# Patient Record
Sex: Male | Born: 1985 | Race: Black or African American | Hispanic: No | Marital: Single | Smoking: Never smoker
Health system: Southern US, Community
[De-identification: ages and names within clinical notes are randomized; demographics above are authoritative.]

## PROBLEM LIST (undated history)

## (undated) DIAGNOSIS — Z789 Other specified health status: Secondary | ICD-10-CM

## (undated) DIAGNOSIS — M25519 Pain in unspecified shoulder: Secondary | ICD-10-CM

## (undated) HISTORY — PX: OTHER SURGICAL HISTORY: SHX169

---

## 2008-01-22 ENCOUNTER — Emergency Department (HOSPITAL_COMMUNITY): Admission: EM | Admit: 2008-01-22 | Discharge: 2008-01-22 | Payer: Self-pay | Admitting: Emergency Medicine

## 2011-11-03 ENCOUNTER — Emergency Department (INDEPENDENT_AMBULATORY_CARE_PROVIDER_SITE_OTHER): Payer: Self-pay

## 2011-11-03 ENCOUNTER — Encounter (HOSPITAL_COMMUNITY): Payer: Self-pay | Admitting: *Deleted

## 2011-11-03 ENCOUNTER — Emergency Department (INDEPENDENT_AMBULATORY_CARE_PROVIDER_SITE_OTHER)
Admission: EM | Admit: 2011-11-03 | Discharge: 2011-11-03 | Disposition: A | Discharge status: Home / Self Care | Payer: Self-pay | Source: Home / Self Care | Attending: Family Medicine | Admitting: Family Medicine

## 2011-11-03 DIAGNOSIS — M7551 Bursitis of right shoulder: Secondary | ICD-10-CM

## 2011-11-03 DIAGNOSIS — M719 Bursopathy, unspecified: Secondary | ICD-10-CM

## 2011-11-03 DIAGNOSIS — M67919 Unspecified disorder of synovium and tendon, unspecified shoulder: Secondary | ICD-10-CM

## 2011-11-03 MED ORDER — DICLOFENAC POTASSIUM 50 MG PO TABS: 50.0000 mg | ORAL_TABLET | Freq: Three times a day (TID) | ORAL | Status: DC

## 2011-11-03 NOTE — ED Provider Notes (Signed)
History     CSN: [id number]  Arrival date & time 11/03/11  1233   First MD Initiated Contact with Patient 11/03/11 1246      Chief Complaint  Patient presents with  . Shoulder Pain    (Consider location/radiation/quality/duration/timing/severity/associated sxs/prior treatment) Patient is a 26 y.o. male presenting with shoulder pain. The history is provided by the patient.  Shoulder Pain This is a chronic problem. The current episode started more than 1 week ago (35mo hx, ). The problem has been gradually worsening. Pertinent negatives include no chest pain and no shortness of breath. Associated symptoms comments: No known injury.. Exacerbated by: worse with raising arm.    History reviewed. No pertinent past medical history.  History reviewed. No pertinent past surgical history.  History reviewed. No pertinent family history.  History  Substance Use Topics  . Smoking status: Never Smoker   . Smokeless tobacco: Not on file  . Alcohol Use: No      Review of Systems  Constitutional: Negative.   HENT: Negative.   Respiratory: Negative for shortness of breath.   Cardiovascular: Negative for chest pain.  Musculoskeletal: Positive for joint swelling.  Skin: Negative.     Allergies  Review of patient's allergies indicates no known allergies.  Home Medications   Current Outpatient Rx  Name Route Sig Dispense Refill  . DICLOFENAC POTASSIUM 50 MG PO TABS Oral Take 1 tablet (50 mg total) by mouth 3 (three) times daily. 30 tablet 0    Pulse 77  Temp(Src) 97.6 F (36.4 C) (Oral)  Resp 16  SpO2 98%  Physical Exam  Nursing note and vitals reviewed. Constitutional: He appears well-developed and well-nourished.  Musculoskeletal: He exhibits tenderness.       Arms:   ED Course  Procedures (including critical care time)  Labs Reviewed - No data to display Dg Shoulder Right  11/03/2011  *RADIOLOGY REPORT*  Clinical Data: Right shoulder pain without injury.  RIGHT  SHOULDER - 2+ VIEW  Comparison: None.  Findings: No acute fracture or dislocation.  Visualized portions of the right hemithorax are within normal limits.  IMPRESSION: Normal right shoulder.  Original Report Authenticated By: Consuello Bossier, M.D.     1. Bursitis of right shoulder       MDM  X-rays reviewed and report per radiologist.         Barkley Bruns, MD 11/03/11 [phone]

## 2011-11-03 NOTE — ED Notes (Signed)
Pt with onset of right shoulder pain x 6 months - increased pain recently with minimal movement - feels like a stabbing pain

## 2011-11-12 ENCOUNTER — Telehealth (HOSPITAL_COMMUNITY): Payer: Self-pay | Admitting: *Deleted

## 2012-04-08 ENCOUNTER — Emergency Department (INDEPENDENT_AMBULATORY_CARE_PROVIDER_SITE_OTHER)
Admission: EM | Admit: 2012-04-08 | Discharge: 2012-04-08 | Disposition: A | Discharge status: Home / Self Care | Payer: BC Managed Care – PPO | Source: Home / Self Care | Attending: Emergency Medicine | Admitting: Emergency Medicine

## 2012-04-08 ENCOUNTER — Encounter (HOSPITAL_COMMUNITY): Payer: Self-pay | Admitting: Emergency Medicine

## 2012-04-08 DIAGNOSIS — M25519 Pain in unspecified shoulder: Secondary | ICD-10-CM

## 2012-04-08 DIAGNOSIS — M25539 Pain in unspecified wrist: Secondary | ICD-10-CM

## 2012-04-08 HISTORY — DX: Pain in unspecified shoulder: M25.519

## 2012-04-08 MED ORDER — NAPROXEN 500 MG PO TABS: 500.0000 mg | ORAL_TABLET | Freq: Two times a day (BID) | ORAL | Status: AC

## 2012-04-08 NOTE — ED Provider Notes (Signed)
History     CSN: [id number]  Arrival date & time 04/08/12  1222   First MD Initiated Contact with Patient 04/08/12 1415      Chief Complaint  Patient presents with  . Hand Pain    (Consider location/radiation/quality/duration/timing/severity/associated sxs/prior treatment) HPI Comments: Shoulder pain several months ago was bursitis, tx here at [facility] and by ortho with cortisone injections.  After several treatments/months, pain went away.  Pt reports pain has returned.  Has not tried anything to make it better. Isn't as bad as last time "I didn't want to wait until it was really bad".  Also c/o B hand/wrist pain for 3 weeks.  Also noticed small "bump" on L wrist yesterday that is very painful to touch and with hyperextension of wrist.    Denies injury to wrist/hands or shoulder but does do repetitive motions and lifting at work.   Patient is a 26 y.o. male presenting with hand pain and shoulder pain. The history is provided by the patient.  Hand Pain This is a new problem. Episode onset: 3 weeks ago. The problem occurs constantly. The problem has been gradually worsening. The symptoms are aggravated by exertion (having hands in hyperextended position, and trying to bear weight with hands in this position). Nothing relieves the symptoms. He has tried nothing for the symptoms.  Shoulder Pain This is a recurrent problem. The current episode started more than 1 week ago. The problem occurs constantly. The problem has been gradually worsening. Exacerbated by: ROM, actively lifting with R arm/shoulder. Nothing relieves the symptoms.    Past Medical History  Diagnosis Date  . Shoulder pain     History reviewed. No pertinent past surgical history.  History reviewed. No pertinent family history.  History  Substance Use Topics  . Smoking status: Never Smoker   . Smokeless tobacco: Not on file  . Alcohol Use: No      Review of Systems  Constitutional: Negative for fever and chills.    Musculoskeletal:       B wrist/hand pain, R shoulder pain same as previous  Skin: Negative for wound.  Neurological: Negative for weakness and numbness.    Allergies  Review of patient's allergies indicates no known allergies.  Home Medications   Current Outpatient Rx  Name Route Sig Dispense Refill  . NAPROXEN 500 MG PO TABS Oral Take 1 tablet (500 mg total) by mouth 2 (two) times daily. 20 tablet 0    BP 155/97  Pulse 82  Temp 98 F (36.7 C) (Oral)  Resp 20  SpO2 99%  Physical Exam  Constitutional: He is oriented to person, place, and time. He appears well-developed and well-nourished. No distress.  Pulmonary/Chest: Effort normal.  Musculoskeletal:       Right shoulder: He exhibits decreased range of motion and tenderness. He exhibits no bony tenderness and no swelling.       Left wrist: He exhibits tenderness. He exhibits no bony tenderness.       Arms:      Right hand: Normal.       Left hand: Normal.       Positive Tinel's sign b wrists. R shoulder flexion and abduction reduced due to pain. Pt reports he can move shoulder through complete ROM, but won't because it hurts too much.   Neurological: He is alert and oriented to person, place, and time. No sensory deficit. Gait normal.  Skin: Skin is warm and dry.    ED Course  Procedures (including critical  care time)  Labs Reviewed - No data to display No results found.   1. Wrist pain   2. Shoulder pain       MDM  Possible cyst L wrist. Probable carpal tunnel syndrome.  Given B wrist splints. Pt reports improvement of pain with splints.  Pt to f/u with hand surgeon who can also help address bursitis in shoulder.        Cathlyn Parsons, NP 04/08/12 1428  Cathlyn Parsons, NP 04/08/12 1429

## 2012-04-08 NOTE — Discharge Instructions (Signed)
Call Dr. Carlos Levering office for an appointment.  He is a hand specialist who should be able to help you with your wrist pain.  He can also help take care of your shoulder.  Use the wrist splints as much as possible to help relieve your pain.

## 2012-04-08 NOTE — ED Notes (Signed)
[facility]. At that time was having shoulder pain, now complaining of right shoulder and both hands.  Reports right shoulder pain never went away completely and is worsening now.   If putting pressure on either palm , patient reports pain goes up arms.  Reports pain in hands 3 weeks ago.  No known injury.  Frequently refers to job related skills/tasks causing pain.

## 2012-04-08 NOTE — ED Provider Notes (Signed)
Medical screening examination/treatment/procedure(s) were performed by non-physician practitioner and as supervising physician I was immediately available for consultation/collaboration.  Raynald Blend, MD 04/08/12 1504

## 2015-02-16 ENCOUNTER — Emergency Department (INDEPENDENT_AMBULATORY_CARE_PROVIDER_SITE_OTHER)
Admission: EM | Admit: 2015-02-16 | Discharge: 2015-02-16 | Disposition: A | Discharge status: Home / Self Care | Payer: Self-pay | Source: Home / Self Care | Attending: Family Medicine | Admitting: Family Medicine

## 2015-02-16 ENCOUNTER — Encounter (HOSPITAL_COMMUNITY): Payer: Self-pay

## 2015-02-16 DIAGNOSIS — H6121 Impacted cerumen, right ear: Secondary | ICD-10-CM

## 2015-02-16 NOTE — ED Notes (Signed)
C/o problem after trying to clean right ear on 4-18. Moderate amt of cerumen noted on inspection.NAD. Pain is "8" on 1-10 scale. Denies vertigo

## 2015-02-16 NOTE — Discharge Instructions (Signed)
Cerumen Impaction °A cerumen impaction is when the wax in your ear forms a plug. This plug usually causes reduced hearing. Sometimes it also causes an earache or dizziness. Removing a cerumen impaction can be difficult and painful. The wax sticks to the ear canal. The canal is sensitive and bleeds easily. If you try to remove a heavy wax buildup with a cotton tipped swab, you may push it in further. °Irrigation with water, suction, and small ear curettes may be used to clear out the wax. If the impaction is fixed to the skin in the ear canal, ear drops may be needed for a few days to loosen the wax. People who build up a lot of wax frequently can use ear wax removal products available in your local drugstore. °SEEK MEDICAL CARE IF:  °You develop an earache, increased hearing loss, or marked dizziness. °Document Released: 11/15/2004 Document Revised: 12/31/2011 Document Reviewed: 01/05/2010 °ExitCare® Patient Information ©2015 [employer]. This information is not intended to replace advice given to you by your health care provider. Make sure you discuss any questions you have with your health care provider. ° °

## 2015-02-16 NOTE — ED Provider Notes (Signed)
CSN: [id number]     Arrival date & time 02/16/15  1146 History   First MD Initiated Contact with Patient 02/16/15 1305     Chief Complaint  Patient presents with   Ear Problem   (Consider location/radiation/quality/duration/timing/severity/associated sxs/prior Treatment) HPI Comments: Pain and decreased hearing right ear for 1 week.   History reviewed. No pertinent past medical history. History reviewed. No pertinent past surgical history. History reviewed. No pertinent family history. History  Substance Use Topics   Smoking status: Never Smoker    Smokeless tobacco: Not on file   Alcohol Use: Yes    Review of Systems  Constitutional: Negative.   HENT: Negative for ear discharge and ear pain.        As per history of present illness  Eyes: Negative.   Respiratory: Negative.     Allergies  Review of patient's allergies indicates no known allergies.  Home Medications   Prior to Admission medications   Not on File   BP 141/94 mmHg   Pulse 20   Temp(Src) 99.5 F (37.5 C) (Oral)   Resp 20   SpO2 100% Physical Exam  Constitutional: He is oriented to person, place, and time. He appears well-developed and well-nourished. No distress.  HENT:  Head: Normocephalic and atraumatic.  Left Ear: External ear normal.  Left TM is normal. EAC is clear. Large cerumen impaction to the right ear.  Neck: Normal range of motion. Neck supple.  Pulmonary/Chest: Effort normal. No respiratory distress.  Neurological: He is alert and oriented to person, place, and time.  Skin: Skin is warm and dry.  Psychiatric: He has a normal mood and affect.  Nursing note and vitals reviewed.   ED Course  EAR CERUMEN REMOVAL Date/Time: 02/16/2015 2:24 PM Performed by: Phineas RealMABE, Temia Debroux Authorized by: Bradd CanaryKINDL, JAMES D Consent: Verbal consent obtained. Risks and benefits: risks, benefits and alternatives were discussed Consent given by: patient Patient understanding: patient states understanding of the  procedure being performed Patient identity confirmed: verbally with patient Local anesthetic: none Location details: right ear Procedure type: irrigation Patient sedated: no Patient tolerance: Patient tolerated the procedure well with no immediate complications   (including critical care time) Labs Review Labs Reviewed - No data to display  Imaging Review No results found.   MDM   1. Cerumen impaction, right    Ear irrigated til clear. Pt feels better. No pain. EAC is clear. TM normal.    Hayden Rasmussenavid Kathlee Barnhardt, NP 02/16/15 1425

## 2015-08-21 ENCOUNTER — Emergency Department (HOSPITAL_COMMUNITY)
Admission: EM | Admit: 2015-08-21 | Discharge: 2015-08-22 | Disposition: A | Discharge status: Home / Self Care | Payer: Self-pay | Attending: Emergency Medicine | Admitting: Emergency Medicine

## 2015-08-21 ENCOUNTER — Emergency Department (HOSPITAL_COMMUNITY): Payer: Self-pay

## 2015-08-21 ENCOUNTER — Encounter (HOSPITAL_COMMUNITY): Payer: Self-pay | Admitting: *Deleted

## 2015-08-21 DIAGNOSIS — S8011XA Contusion of right lower leg, initial encounter: Secondary | ICD-10-CM | POA: Insufficient documentation

## 2015-08-21 DIAGNOSIS — Y998 Other external cause status: Secondary | ICD-10-CM | POA: Insufficient documentation

## 2015-08-21 DIAGNOSIS — Y9389 Activity, other specified: Secondary | ICD-10-CM | POA: Insufficient documentation

## 2015-08-21 DIAGNOSIS — Y92410 Unspecified street and highway as the place of occurrence of the external cause: Secondary | ICD-10-CM | POA: Insufficient documentation

## 2015-08-21 DIAGNOSIS — S7001XA Contusion of right hip, initial encounter: Secondary | ICD-10-CM | POA: Insufficient documentation

## 2015-08-21 DIAGNOSIS — S3992XA Unspecified injury of lower back, initial encounter: Secondary | ICD-10-CM | POA: Insufficient documentation

## 2015-08-21 MED ORDER — KETOROLAC TROMETHAMINE 60 MG/2ML IM SOLN
60.0000 mg | Freq: Once | INTRAMUSCULAR | Status: AC
  Administered 2015-08-21: 60 mg via INTRAMUSCULAR
  Filled 2015-08-21: qty 2

## 2015-08-21 MED ORDER — IBUPROFEN 800 MG PO TABS: 800.0000 mg | ORAL_TABLET | Freq: Four times a day (QID) | ORAL | Status: DC | PRN
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
## 2015-08-21 NOTE — Discharge Instructions (Signed)
Contusion A contusion is a deep bruise. Contusions are the result of a blunt injury to tissues and muscle fibers under the skin. The injury causes bleeding under the skin. The skin overlying the contusion may turn blue, purple, or yellow. Minor injuries will give you a painless contusion, but more severe contusions may stay painful and swollen for a few weeks.  CAUSES  This condition is usually caused by a blow, trauma, or direct force to an area of the body. SYMPTOMS  Symptoms of this condition include:  Swelling of the injured area.  Pain and tenderness in the injured area.  Discoloration. The area may have redness and then turn blue, purple, or yellow. DIAGNOSIS  This condition is diagnosed based on a physical exam and medical history. An X-ray, CT scan, or MRI may be needed to determine if there are any associated injuries, such as broken bones (fractures). TREATMENT  Specific treatment for this condition depends on what area of the body was injured. In general, the best treatment for a contusion is resting, icing, applying pressure to (compression), and elevating the injured area. This is often called the RICE strategy. Over-the-counter anti-inflammatory medicines may also be recommended for pain control.  HOME CARE INSTRUCTIONS   Rest the injured area.  If directed, apply ice to the injured area:  Put ice in a plastic bag.  Place a towel between your skin and the bag.  Leave the ice on for 20 minutes, 2-3 times per day.  If directed, apply light compression to the injured area using an elastic bandage. Make sure the bandage is not wrapped too tightly. Remove and reapply the bandage as directed by your health care provider.  If possible, raise (elevate) the injured area above the level of your heart while you are sitting or lying down.  Take over-the-counter and prescription medicines only as told by your health care provider. SEEK MEDICAL CARE IF:  Your symptoms do not  improve after several days of treatment.  Your symptoms get worse.  You have difficulty moving the injured area. SEEK IMMEDIATE MEDICAL CARE IF:   You have severe pain.  You have numbness in a hand or foot.  Your hand or foot turns pale or cold.   This information is not intended to replace advice given to you by your health care provider. Make sure you discuss any questions you have with your health care provider.   Document Released: 07/18/2005 Document Revised: 06/29/2015 Document Reviewed: 02/23/2015 Elsevier Interactive Patient Education 2016 [employer]  Iliac Crest Contusion An iliac crest contusion is a deep bruise of your hip bone (hip pointer). Contusions are the result of an injury that caused bleeding under the skin. The contusion may turn blue, purple, or yellow. Minor injuries will give you a painless contusion, but more severe contusions may stay painful and swollen for a few weeks.  CAUSES  An iliac crest contusion is usually caused by a blow to the top of your hip pointer. The injury most often comes from contact sports.  SYMPTOMS   Swelling and redness of the hip area.  Bruising of the hip area.  Tenderness or soreness of the hip. DIAGNOSIS  The diagnosis can be made by taking your history and doing a physical exam. You may need an X-ray of your hip pointer to look for a broken bone (fracture). TREATMENT  Often, the best treatment for an iliac crest contusion is resting, icing, elevating, and applying cold compresses to the injured area. Over-the-counter  medicines may also be recommended for pain control. Crutches may be used if walking is very painful. Some people need physical therapy to help with range of motion and strengthening.  HOME CARE INSTRUCTIONS   Put ice on the injured area.  Put ice in a plastic bag.  Place a towel between your skin and the bag.  Leave the ice on for 15-20 minutes, 03-04 times a day.  Only take over-the-counter or  prescription medicines for pain, discomfort, or fever as directed by your caregiver. Your caregiver may recommend avoiding anti-inflammatory medicines (aspirin, ibuprofen, and naproxen) for 48 hours because these medicines may increase bruising.  Keep your leg straightened (extended) when possible.  Walk or move around as the pain allows, or as directed by your caregiver. Use crutches if your caregiver recommends them.  Apply compression wraps as directed by your caregiver. You may remove the wraps for sleeping, showers, and baths. SEEK IMMEDIATE MEDICAL CARE IF:   You have increased bruising or swelling.  You have pain that is getting worse.  Your swelling or pain is not relieved with medicines.  Your toes get cold. MAKE SURE YOU:   Understand these instructions.  Will watch your condition.  Will get help right away if you are not doing well or get worse.   This information is not intended to replace advice given to you by your health care provider. Make sure you discuss any questions you have with your health care provider.   Document Released: 07/03/2001 Document Revised: 12/31/2011 Document Reviewed: 02/23/2015 Elsevier Interactive Patient Education [employer]

## 2015-08-21 NOTE — ED Notes (Signed)
Pt placed in a gown and hooked up to the monitor with a BP cuff and pulse ox 
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
## 2015-08-22 NOTE — ED Notes (Signed)
Reviewed discharge instructions with patient, who verbalized understanding. VSS.

## 2019-05-04 ENCOUNTER — Encounter: Payer: Self-pay | Admitting: Emergency Medicine

## 2019-05-04 ENCOUNTER — Emergency Department: Payer: Self-pay

## 2019-05-04 ENCOUNTER — Other Ambulatory Visit: Payer: Self-pay

## 2019-05-04 ENCOUNTER — Inpatient Hospital Stay
Admission: RE | Admit: 2019-05-04 | Discharge: 2019-05-06 | DRG: 558 | Disposition: A | Discharge status: Home / Self Care | Payer: Self-pay | Attending: Internal Medicine | Admitting: Internal Medicine

## 2019-05-04 DIAGNOSIS — Z833 Family history of diabetes mellitus: Secondary | ICD-10-CM

## 2019-05-04 DIAGNOSIS — E876 Hypokalemia: Secondary | ICD-10-CM | POA: Diagnosis present

## 2019-05-04 DIAGNOSIS — N179 Acute kidney failure, unspecified: Secondary | ICD-10-CM | POA: Diagnosis present

## 2019-05-04 DIAGNOSIS — Z20828 Contact with and (suspected) exposure to other viral communicable diseases: Secondary | ICD-10-CM | POA: Diagnosis present

## 2019-05-04 DIAGNOSIS — E860 Dehydration: Secondary | ICD-10-CM | POA: Diagnosis present

## 2019-05-04 DIAGNOSIS — M6282 Rhabdomyolysis: Secondary | ICD-10-CM | POA: Diagnosis present

## 2019-05-04 HISTORY — DX: Other specified health status: Z78.9

## 2019-05-04 LAB — COMPREHENSIVE METABOLIC PANEL
ALT: 40 U/L (ref 0–44)
AST: 77 U/L — ABNORMAL HIGH (ref 15–41)
Albumin: 5.2 g/dL — ABNORMAL HIGH (ref 3.5–5.0)
Alkaline Phosphatase: 70 U/L (ref 38–126)
Anion gap: 20 — ABNORMAL HIGH (ref 5–15)
BUN: 44 mg/dL — ABNORMAL HIGH (ref 6–20)
CO2: 25 mmol/L (ref 22–32)
Calcium: 10.4 mg/dL — ABNORMAL HIGH (ref 8.9–10.3)
Chloride: 87 mmol/L — ABNORMAL LOW (ref 98–111)
Creatinine, Ser: 2.44 mg/dL — ABNORMAL HIGH (ref 0.61–1.24)
GFR calc Af Amer: 39 mL/min — ABNORMAL LOW (ref >60)
GFR calc non Af Amer: 34 mL/min — ABNORMAL LOW (ref >60)
Glucose, Bld: 137 mg/dL — ABNORMAL HIGH (ref 70–99)
Potassium: 2.8 mmol/L — ABNORMAL LOW (ref 3.5–5.1)
Sodium: 132 mmol/L — ABNORMAL LOW (ref 135–145)
Total Bilirubin: 2.3 mg/dL — ABNORMAL HIGH (ref 0.3–1.2)
Total Protein: 9.4 g/dL — ABNORMAL HIGH (ref 6.5–8.1)

## 2019-05-04 LAB — CBC
HCT: 48.7 % (ref 39.0–52.0)
Hemoglobin: 17.5 g/dL — ABNORMAL HIGH (ref 13.0–17.0)
MCH: 30.3 pg (ref 26.0–34.0)
MCHC: 35.9 g/dL (ref 30.0–36.0)
MCV: 84.3 fL (ref 80.0–100.0)
Platelets: 352 10*3/uL (ref 150–400)
RBC: 5.78 MIL/uL (ref 4.22–5.81)
RDW: 12 % (ref 11.5–15.5)
WBC: 12.3 10*3/uL — ABNORMAL HIGH (ref 4.0–10.5)
nRBC: 0 % (ref 0.0–0.2)

## 2019-05-04 LAB — MAGNESIUM: Magnesium: 2.8 mg/dL — ABNORMAL HIGH (ref 1.7–2.4)

## 2019-05-04 LAB — SARS CORONAVIRUS 2 BY RT PCR (HOSPITAL ORDER, PERFORMED IN ~~LOC~~ HOSPITAL LAB): SARS Coronavirus 2: NEGATIVE

## 2019-05-04 LAB — CK: Total CK: 2397 U/L — ABNORMAL HIGH (ref 49–397)

## 2019-05-04 LAB — LIPASE, BLOOD: Lipase: 42 U/L (ref 11–51)

## 2019-05-04 LAB — LACTIC ACID, PLASMA: Lactic Acid, Venous: 1.7 mmol/L (ref 0.5–1.9)

## 2019-05-04 MED ORDER — SODIUM CHLORIDE 0.9 % IV BOLUS
1000.0000 mL | Freq: Once | INTRAVENOUS | Status: AC
  Administered 2019-05-04: 1000 mL via INTRAVENOUS

## 2019-05-04 MED ORDER — POTASSIUM CHLORIDE CRYS ER 20 MEQ PO TBCR
40.0000 meq | EXTENDED_RELEASE_TABLET | Freq: Once | ORAL | Status: AC
  Administered 2019-05-04: 40 meq via ORAL
  Filled 2019-05-04: qty 2

## 2019-05-04 MED ORDER — PANTOPRAZOLE SODIUM 40 MG IV SOLR
40.0000 mg | Freq: Once | INTRAVENOUS | Status: AC
  Administered 2019-05-04: 40 mg via INTRAVENOUS
  Filled 2019-05-04: qty 40

## 2019-05-04 MED ORDER — ENOXAPARIN SODIUM 30 MG/0.3ML ~~LOC~~ SOLN
30.0000 mg | SUBCUTANEOUS | Status: DC
  Administered 2019-05-04: 18:00:00 30 mg via SUBCUTANEOUS
  Filled 2019-05-04: qty 0.3

## 2019-05-04 MED ORDER — SODIUM CHLORIDE 0.9 % IV SOLN
INTRAVENOUS | Status: DC
  Administered 2019-05-04 – 2019-05-06 (×6): via INTRAVENOUS

## 2019-05-04 MED ORDER — POTASSIUM CHLORIDE 10 MEQ/100ML IV SOLN
10.0000 meq | INTRAVENOUS | Status: DC
  Filled 2019-05-04 (×4): qty 100

## 2019-05-04 MED ORDER — ALUM & MAG HYDROXIDE-SIMETH 200-200-20 MG/5ML PO SUSP
15.0000 mL | Freq: Once | ORAL | Status: AC
  Administered 2019-05-04: 15 mL via ORAL
  Filled 2019-05-04: qty 30

## 2019-05-04 MED ORDER — HYDROMORPHONE HCL 1 MG/ML IJ SOLN
0.5000 mg | INTRAMUSCULAR | Status: AC
  Administered 2019-05-04: 0.5 mg via INTRAVENOUS
  Filled 2019-05-04: qty 1

## 2019-05-04 MED ORDER — ONDANSETRON HCL 4 MG/2ML IJ SOLN
4.0000 mg | Freq: Once | INTRAMUSCULAR | Status: AC
  Administered 2019-05-04: 4 mg via INTRAVENOUS
  Filled 2019-05-04: qty 2

## 2019-05-04 MED ORDER — ACETAMINOPHEN 650 MG RE SUPP: 650.0000 mg | Freq: Four times a day (QID) | RECTAL | Status: DC | PRN

## 2019-05-04 MED ORDER — SODIUM CHLORIDE 0.9% FLUSH
3.0000 mL | Freq: Once | INTRAVENOUS | Status: AC
  Administered 2019-05-04: 3 mL via INTRAVENOUS

## 2019-05-04 MED ORDER — ONDANSETRON HCL 4 MG/2ML IJ SOLN
4.0000 mg | Freq: Once | INTRAMUSCULAR | Status: AC | PRN
  Administered 2019-05-04: 4 mg via INTRAVENOUS
  Filled 2019-05-04: qty 2

## 2019-05-04 MED ORDER — POTASSIUM CHLORIDE 10 MEQ/100ML IV SOLN
10.0000 meq | Freq: Once | INTRAVENOUS | Status: AC
  Administered 2019-05-04: 10 meq via INTRAVENOUS
  Filled 2019-05-04: qty 100

## 2019-05-04 MED ORDER — ONDANSETRON HCL 4 MG/2ML IJ SOLN
4.0000 mg | Freq: Four times a day (QID) | INTRAMUSCULAR | Status: DC | PRN
  Administered 2019-05-04: 18:00:00 4 mg via INTRAVENOUS
  Filled 2019-05-04: qty 2

## 2019-05-04 MED ORDER — ACETAMINOPHEN 325 MG PO TABS: 650.0000 mg | ORAL_TABLET | Freq: Four times a day (QID) | ORAL | Status: DC | PRN

## 2019-05-04 MED ORDER — ENOXAPARIN SODIUM 40 MG/0.4ML ~~LOC~~ SOLN
40.0000 mg | SUBCUTANEOUS | Status: DC
  Administered 2019-05-05: 40 mg via SUBCUTANEOUS
  Filled 2019-05-04: qty 0.4

## 2019-05-04 MED ORDER — ONDANSETRON HCL 4 MG PO TABS: 4.0000 mg | ORAL_TABLET | Freq: Four times a day (QID) | ORAL | Status: DC | PRN

## 2019-05-04 MED ORDER — POTASSIUM CHLORIDE 10 MEQ/100ML IV SOLN
10.0000 meq | INTRAVENOUS | Status: AC
  Administered 2019-05-04 (×3): 10 meq via INTRAVENOUS
  Filled 2019-05-04 (×3): qty 100

## 2019-05-04 MED ORDER — HYDROCODONE-ACETAMINOPHEN 5-325 MG PO TABS
1.0000 | ORAL_TABLET | ORAL | Status: DC | PRN
  Administered 2019-05-04: 2 via ORAL
  Filled 2019-05-04: qty 2

## 2019-05-04 NOTE — Plan of Care (Signed)

## 2019-05-04 NOTE — ED Triage Notes (Signed)
C/O emesis since Wednesday.  States unable to keep anything down at all.

## 2019-05-04 NOTE — ED Notes (Addendum)
Spoke with lab in regards to in house versus send out corona virus collection, [facility] who reports she will change order to in house as ordered by admitted physician.

## 2019-05-04 NOTE — Progress Notes (Signed)
PHARMACIST - PHYSICIAN COMMUNICATION  CONCERNING:  Enoxaparin (Lovenox) for DVT Prophylaxis    RECOMMENDATION: Patient was prescribed enoxaprin 30mg  q24 hours for VTE prophylaxis.   Filed Weights   05/04/19 0958  Weight: 235 lb (106.6 kg)    Body mass index is 31.87 kg/m.  Estimated Creatinine Clearance: 55.3 mL/min (A) (by C-G formula based on SCr of 2.44 mg/dL (H)).   Based on [place] patient is candidate for enoxaparin 40mg  every 24 hours based on CrCl > 62ml/min  DESCRIPTION: Pharmacy has adjusted enoxaparin dose per [facility] policy.   Patient is now receiving enoxaparin 40mg  every 24 hours.  Tawnya Crook, PharmD Clinical Pharmacist  05/04/2019 7:22 PM

## 2019-05-04 NOTE — ED Notes (Addendum)
ED TO INPATIENT HANDOFF REPORT  ED Nurse Name and Phone #: ally [phone]  S Name/Age/Gender Oral Jacob Vargas 33 y.o. male Room/Bed: ED03A/ED03A  Code Status   Code Status: Not on file  Home/SNF/Other Home {Patient oriented [phone]::: yes Is this baseline? Yes   Triage Complete: Triage complete  Chief Complaint vomiting/cp since wed  Triage Note C/O emesis since Wednesday.  States unable to keep anything down at all.   Allergies No Known Allergies  Level of Care/Admitting Diagnosis ED Disposition    ED Disposition Condition Comment   Admit  Hospital Area: [facility] [[id number]]  Level of Care: Med-Surg [16]  Covid Evaluation: Person Under Investigation (PUI)  Diagnosis: Rhabdomyolysis [728.88.ICD-9-CM]  Admitting Physician: Auburn Bilberry [[id number]]  Attending Physician: Auburn Bilberry [[id number]]  Estimated length of stay: past midnight tomorrow  Certification:: I certify this patient will need inpatient services for at least 2 midnights  PT Class (Do Not Modify): Inpatient [101]  PT Acc Code (Do Not Modify): Private [1]       B Medical/Surgery History Past Medical History:  Diagnosis Date  . Patient denies medical problems    Past Surgical History:  Procedure Laterality Date  . pt denies       A IV Location/Drains/Wounds Patient Lines/Drains/Airways Status   Active Line/Drains/Airways    Name:   Placement date:   Placement time:   Site:   Days:   Peripheral IV 05/04/19 Right Antecubital   05/04/19    1008    Antecubital   less than 1          Intake/Output Last 24 hours  Intake/Output Summary (Last 24 hours) at 05/04/2019 1430 Last data filed at 05/04/2019 1201 Gross per 24 hour  Intake 1000 ml  Output --  Net 1000 ml    Labs/Imaging Results for orders placed or performed during the hospital encounter of 05/04/19 (from the past 48 hour(s))  Lipase, blood     Status: None   Collection Time: 05/04/19 10:06 AM  Result  Value Ref Range   Lipase 42 11 - 51 U/L    Comment: Performed at [facility], [address] [place], [place] [address]  Comprehensive metabolic panel     Status: Abnormal   Collection Time: 05/04/19 10:06 AM  Result Value Ref Range   Sodium 132 (L) 135 - 145 mmol/L   Potassium 2.8 (L) 3.5 - 5.1 mmol/L   Chloride 87 (L) 98 - 111 mmol/L   CO2 25 22 - 32 mmol/L   Glucose, Bld 137 (H) 70 - 99 mg/dL   BUN 44 (H) 6 - 20 mg/dL   Creatinine, Ser 8.65 (H) 0.61 - 1.24 mg/dL   Calcium 78.4 (H) 8.9 - 10.3 mg/dL   Total Protein 9.4 (H) 6.5 - 8.1 g/dL   Albumin 5.2 (H) 3.5 - 5.0 g/dL   AST 77 (H) 15 - 41 U/L   ALT 40 0 - 44 U/L   Alkaline Phosphatase 70 38 - 126 U/L   Total Bilirubin 2.3 (H) 0.3 - 1.2 mg/dL   GFR calc non Af Amer 34 (L) >60 mL/min   GFR calc Af Amer 39 (L) >60 mL/min   Anion gap 20 (H) 5 - 15    Comment: Performed at [facility], [address] [place], [place] [address]  CBC     Status: Abnormal   Collection Time: 05/04/19 10:06 AM  Result Value Ref Range   WBC 12.3 (H) 4.0 -  10.5 K/uL   RBC 5.78 4.22 - 5.81 MIL/uL   Hemoglobin 17.5 (H) 13.0 - 17.0 g/dL   HCT 82.9 56.2 - 13.0 %   MCV 84.3 80.0 - 100.0 fL   MCH 30.3 26.0 - 34.0 pg   MCHC 35.9 30.0 - 36.0 g/dL   RDW 86.5 78.4 - 69.6 %   Platelets 352 150 - 400 K/uL   nRBC 0.0 0.0 - 0.2 %    Comment: Performed at [facility], [address], [place], [place] [address]  Magnesium     Status: Abnormal   Collection Time: 05/04/19 10:06 AM  Result Value Ref Range   Magnesium 2.8 (H) 1.7 - 2.4 mg/dL    Comment: Performed at [facility], [address] [place], [place] [address]  CK     Status: Abnormal   Collection Time: 05/04/19 10:06 AM  Result Value Ref Range   Total CK 2,397 (H) 49 - 397 U/L    Comment: Performed at [facility], [address] [place], [place] [address]  Lactic acid, plasma     Status: None   Collection Time: 05/04/19 12:00 PM  Result Value  Ref Range   Lactic Acid, Venous 1.7 0.5 - 1.9 mmol/L    Comment: Performed at [facility], [address] [place], [place] [address]   Ct Abdomen Pelvis Wo Contrast  Result Date: 05/04/2019 CLINICAL DATA:  Nausea, vomiting, abdominal pain and body aches EXAM: CT ABDOMEN AND PELVIS WITHOUT CONTRAST TECHNIQUE: Multidetector CT imaging of the abdomen and pelvis was performed following the standard protocol without IV contrast. COMPARISON:  None. FINDINGS: Lower chest: No acute abnormality. Hepatobiliary: No solid liver abnormality is seen. No gallstones, gallbladder wall thickening, or biliary dilatation. Pancreas: Unremarkable. No pancreatic ductal dilatation or surrounding inflammatory changes. Spleen: Normal in size without significant abnormality. Adrenals/Urinary Tract: Adrenal glands are unremarkable. Kidneys are normal, without renal calculi, solid lesion, or hydronephrosis. Bladder is unremarkable. Stomach/Bowel: Stomach is within normal limits. Appendix appears normal. No evidence of bowel wall thickening, distention, or inflammatory changes. Vascular/Lymphatic: No significant vascular findings are present. No enlarged abdominal or pelvic lymph nodes. Reproductive: No mass or other significant abnormality. Other: No abdominal wall hernia or abnormality. No abdominopelvic ascites. Musculoskeletal: No acute or significant osseous findings. IMPRESSION: No non-contrast CT findings of the abdomen pelvis to explain abdominal pain, nausea, vomiting, or body aches. Electronically Signed   By: Lauralyn Primes M.D.   On: 05/04/2019 12:32    Pending Labs Unresulted Labs (From admission, onward)    Start     Ordered   05/05/19 0500  CK  Tomorrow morning,   STAT     05/04/19 1321   05/05/19 0500  Potassium  Tomorrow morning,   STAT     05/04/19 1336   05/04/19 1409  Urine Drug Screen, Qualitative ([facility] only)  Once,   STAT     05/04/19 1408   05/04/19 1317  SARS Coronavirus 2 (CEPHEID- Performed  in [facility] hospital lab), Hosp Order  (Symptomatic Patients Labs with Precautions )  ONCE - STAT,   STAT     05/04/19 1316   05/04/19 1000  Urinalysis, Complete w Microscopic  ONCE - STAT,   STAT     05/04/19 1005   Signed and Held  HIV antibody (Routine Testing)  Once,   R     Signed and Held   Signed and Held  CBC  (enoxaparin (LOVENOX)    CrCl <  30 ml/min)  Once,   R    Comments: Baseline for enoxaparin therapy IF NOT ALREADY DRAWN.  Notify MD if PLT < 100 K.    Signed and Held   Signed and Held  Creatinine, serum  (enoxaparin (LOVENOX)    CrCl < 30 ml/min)  Once,   R    Comments: Baseline for enoxaparin therapy IF NOT ALREADY DRAWN.    Signed and Held   Signed and Held  Creatinine, serum  (enoxaparin (LOVENOX)    CrCl < 30 ml/min)  Weekly,   R    Comments: while on enoxaparin therapy.    Signed and Held   Signed and Held  CBC  Tomorrow morning,   R     Signed and Held   Signed and Held  Comprehensive metabolic panel  Tomorrow morning,   R     Signed and Held          Vitals/Pain Today's Vitals   05/04/19 1130 05/04/19 1345 05/04/19 1400 05/04/19 1429  BP: (!) 137/93 (!) 144/96 (!) 146/93   Pulse: 93 (!) 108 82   Resp:      Temp:      TempSrc:      SpO2: 99% 100% 97%   Weight:      Height:      PainSc:    6     Isolation Precautions Airborne and Contact precautions  Medications Medications  potassium chloride 10 mEq in 100 mL IVPB ( Intravenous Rate/Dose Change 05/04/19 1419)  potassium chloride 10 mEq in 100 mL IVPB (has no administration in time range)  sodium chloride flush (NS) 0.9 % injection 3 mL (3 mLs Intravenous Given 05/04/19 1349)  ondansetron (ZOFRAN) injection 4 mg (4 mg Intravenous Given 05/04/19 1009)  sodium chloride 0.9 % bolus 1,000 mL (0 mLs Intravenous Stopped 05/04/19 1201)  sodium chloride 0.9 % bolus 1,000 mL (0 mLs Intravenous Stopped 05/04/19 1419)  pantoprazole (PROTONIX) injection 40 mg (40 mg Intravenous Given 05/04/19 1159)   HYDROmorphone (DILAUDID) injection 0.5 mg (0.5 mg Intravenous Given 05/04/19 1159)  ondansetron (ZOFRAN) injection 4 mg (4 mg Intravenous Given 05/04/19 1158)  alum & mag hydroxide-simeth (MAALOX/MYLANTA) 200-200-20 MG/5ML suspension 15 mL (15 mLs Oral Given 05/04/19 1159)  HYDROmorphone (DILAUDID) injection 0.5 mg (0.5 mg Intravenous Given 05/04/19 1341)  potassium chloride SA (K-DUR) CR tablet 40 mEq (40 mEq Oral Given 05/04/19 1341)    Mobility walks Low fall risk   Focused Assessments Cardiac Assessment Handoff:    Lab Results  Component Value Date   CKTOTAL 2,397 (H) 05/04/2019   No results found for: DDIMER Does the Patient currently have chest pain? No     R Recommendations: See Admitting Provider Note  Report given to:   Additional Notes:  Dehydration after exertion, elevated kidney function, decreased potassium, elevated CK

## 2019-05-04 NOTE — ED Notes (Signed)
Pt reports he became very hot and fatigued at work on Wednesday, since he has had NV with fluid and food, body aches, syncope with emesis.

## 2019-05-04 NOTE — ED Notes (Signed)
Report to Caryl Pina, Therapist, sports.

## 2019-05-04 NOTE — H&P (Signed)
[facility] - [place] [facility]   PATIENT NAME: Havish Marling    MR#:  [id number]  DATE OF BIRTH:  1986-04-13  DATE OF ADMISSION:  05/04/2019  PRIMARY CARE PHYSICIAN: No primary care provider on file.   REQUESTING/REFERRING PHYSICIAN: Sharyn Creamer, MD  CHIEF COMPLAINT:   Chief Complaint  Patient presents with  . Emesis    HISTORY OF PRESENT ILLNESS: Onel Kong  is a 33 y.o. male with no medical history who is presenting to the ER with complaint of having nausea vomiting and generalized body aches for the past few days.  Patient states that he works for [employer] and he lifted some furniture few days ago in the heat and subsequently started having generalized body aches and severe nausea and vomiting.  Patient states that he has not had any fevers but has had chills.  In the emergency room he was noted to have rhabdomyolysis as well as acute renal failure.  He denies any chest pain or palpitations.       PAST MEDICAL HISTORY:   Past Medical History:  Diagnosis Date  . Patient denies medical problems     PAST SURGICAL HISTORY:  Past Surgical History:  Procedure Laterality Date  . pt denies      SOCIAL HISTORY:  Social History   Tobacco Use  . Smoking status: Never Smoker  . Smokeless tobacco: Never Used  Substance Use Topics  . Alcohol use: Yes    Comment: occasionaly    FAMILY HISTORY:  Family History  Problem Relation Age of Onset  . Diabetes Mother     DRUG ALLERGIES: No Known Allergies  REVIEW OF SYSTEMS:   CONSTITUTIONAL: No fever, positive fatigue or positive weakness.  EYES: No blurred or double vision.  EARS, NOSE, AND THROAT: No tinnitus or ear pain.  RESPIRATORY: No cough, shortness of breath, wheezing or hemoptysis.  CARDIOVASCULAR: No chest pain, orthopnea, edema.  GASTROINTESTINAL: No nausea, vomiting, diarrhea or abdominal pain.  GENITOURINARY: No dysuria, hematuria.  ENDOCRINE: No polyuria, nocturia,   HEMATOLOGY: No anemia, easy bruising or bleeding SKIN: No rash or lesion. MUSCULOSKELETAL: No joint pain or arthritis.  Positive pain diffusely NEUROLOGIC: No tingling, numbness, weakness.  PSYCHIATRY: No anxiety or depression.   MEDICATIONS AT HOME:  Prior to Admission medications   Not on File      PHYSICAL EXAMINATION:   VITAL SIGNS: Blood pressure (!) 137/93, pulse 93, temperature 99 F (37.2 C), temperature source Oral, resp. rate 18, height 6' (1.829 m), weight 106.6 kg, SpO2 99 %.  GENERAL:  33 y.o.-year-old patient lying in the bed with no acute distress.  EYES: Pupils equal, round, reactive to light and accommodation. No scleral icterus. Extraocular muscles intact.  HEENT: Head atraumatic, normocephalic. Oropharynx and nasopharynx clear.  NECK:  Supple, no jugular venous distention. No thyroid enlargement, no tenderness.  LUNGS: Normal breath sounds bilaterally, no wheezing, rales,rhonchi or crepitation. No use of accessory muscles of respiration.  CARDIOVASCULAR: S1, S2 normal. No murmurs, rubs, or gallops.  ABDOMEN: Soft, nontender, nondistended. Bowel sounds present. No organomegaly or mass.  EXTREMITIES: No pedal edema, cyanosis, or clubbing.  NEUROLOGIC: Cranial nerves II through XII are intact. Muscle strength 5/5 in all extremities. Sensation intact. Gait not checked.  PSYCHIATRIC: The patient is alert and oriented x 3.  SKIN: No obvious rash, lesion, or ulcer.   LABORATORY PANEL:   CBC Recent Labs  Lab 05/04/19 1006  WBC 12.3*  HGB 17.5*  HCT 48.7  PLT 352  MCV 84.3  MCH 30.3  MCHC 35.9  RDW 12.0   ------------------------------------------------------------------------------------------------------------------  Chemistries  Recent Labs  Lab 05/04/19 1006  NA 132*  K 2.8*  CL 87*  CO2 25  GLUCOSE 137*  BUN 44*  CREATININE 2.44*  CALCIUM 10.4*  MG 2.8*  AST 77*  ALT 40  ALKPHOS 70  BILITOT 2.3*    ------------------------------------------------------------------------------------------------------------------ estimated creatinine clearance is 55.3 mL/min (A) (by C-G formula based on SCr of 2.44 mg/dL (H)). ------------------------------------------------------------------------------------------------------------------ No results for input(s): TSH, T4TOTAL, T3FREE, THYROIDAB in the last 72 hours.  Invalid input(s): FREET3   Coagulation profile No results for input(s): INR, PROTIME in the last 168 hours. ------------------------------------------------------------------------------------------------------------------- No results for input(s): DDIMER in the last 72 hours. -------------------------------------------------------------------------------------------------------------------  Cardiac Enzymes No results for input(s): CKMB, TROPONINI, MYOGLOBIN in the last 168 hours.  Invalid input(s): CK ------------------------------------------------------------------------------------------------------------------ Invalid input(s): POCBNP  ---------------------------------------------------------------------------------------------------------------  Urinalysis No results found for: COLORURINE, APPEARANCEUR, LABSPEC, PHURINE, GLUCOSEU, HGBUR, BILIRUBINUR, KETONESUR, PROTEINUR, UROBILINOGEN, NITRITE, LEUKOCYTESUR   RADIOLOGY: Ct Abdomen Pelvis Wo Contrast  Result Date: 05/04/2019 CLINICAL DATA:  Nausea, vomiting, abdominal pain and body aches EXAM: CT ABDOMEN AND PELVIS WITHOUT CONTRAST TECHNIQUE: Multidetector CT imaging of the abdomen and pelvis was performed following the standard protocol without IV contrast. COMPARISON:  None. FINDINGS: Lower chest: No acute abnormality. Hepatobiliary: No solid liver abnormality is seen. No gallstones, gallbladder wall thickening, or biliary dilatation. Pancreas: Unremarkable. No pancreatic ductal dilatation or surrounding inflammatory  changes. Spleen: Normal in size without significant abnormality. Adrenals/Urinary Tract: Adrenal glands are unremarkable. Kidneys are normal, without renal calculi, solid lesion, or hydronephrosis. Bladder is unremarkable. Stomach/Bowel: Stomach is within normal limits. Appendix appears normal. No evidence of bowel wall thickening, distention, or inflammatory changes. Vascular/Lymphatic: No significant vascular findings are present. No enlarged abdominal or pelvic lymph nodes. Reproductive: No mass or other significant abnormality. Other: No abdominal wall hernia or abnormality. No abdominopelvic ascites. Musculoskeletal: No acute or significant osseous findings. IMPRESSION: No non-contrast CT findings of the abdomen pelvis to explain abdominal pain, nausea, vomiting, or body aches. Electronically Signed   By: Lauralyn Primes M.D.   On: 05/04/2019 12:32    EKG: Orders placed or performed in visit on 05/04/19  . EKG 12-Lead  . EKG 12-Lead  . EKG 12-Lead    IMPRESSION AND PLAN: Patient is a 33 year old presenting with nausea vomiting generalized body aches  1.  Acute rhabdomyolysis in the setting of heat and lifting of furniture I will give aggressive IV fluids follow her CPK level in the morning  2.  Acute renal failure due to rhabdomyolysis and dehydration we will give aggressive IV fluids follow renal function if no improvement nephrology consult in the morning  3.  Nausea chills etiology unclear I will order a stat COVID test he has a [place] but COVID needs to be ruled out in this patient with unusual symptoms    All the records are reviewed and case discussed with ED provider. Management plans discussed with the patient, family and they are in agreement.  CODE STATUS: Full code    TOTAL TIME TAKING CARE OF THIS PATIENT: 55 minutes.    Auburn Bilberry M.D on 05/04/2019 at 1:28 PM  Between 7am to 6pm - Pager - [phone]  After 6pm go to [email or web address] - password [email or web address]  [facility] Office  [phone]  CC: Primary care physician; No primary care provider on file.

## 2019-05-04 NOTE — ED Notes (Signed)
Pt to receive 4 total potassium runs. Pt alert and oriented X4, active, cooperative, pt in NAD. RR even and unlabored, color WNL.

## 2019-05-04 NOTE — Progress Notes (Signed)
PHARMACY CONSULT NOTE - FOLLOW UP  Pharmacy Consult for Electrolyte Monitoring and Replacement   Recent Labs: Potassium (mmol/L)  Date Value  05/04/2019 2.8 (L)   Magnesium (mg/dL)  Date Value  05/04/2019 2.8 (H)   Calcium (mg/dL)  Date Value  05/04/2019 10.4 (H)   Albumin (g/dL)  Date Value  05/04/2019 5.2 (H)   Sodium (mmol/L)  Date Value  05/04/2019 132 (L)     Assessment: 33 year old male presented with N/V. Potassium on admission 2.8.  Goal of Therapy:  K ~ 4 Mg ~ 2  Plan:  K 2.8. Will give potassium 40 mEq PO x 1 in addition to 10 mEq IV x 4. Patient also has a one time order for 10 mEq IV. This will give the patient a total of 90 mEq potassium.  Will check potassium with morning labs.  Pharmacy to continue to follow and replace electrolytes as needed.  Tawnya Crook ,PharmD Clinical Pharmacist 05/04/2019 1:30 PM

## 2019-05-04 NOTE — ED Provider Notes (Signed)
[facility] Emergency Department Provider Note   ____________________________________________   First MD Initiated Contact with Patient 05/04/19 1141     (approximate)  I have reviewed the triage vital signs and the nursing notes.   HISTORY  Chief Complaint Emesis    HPI Jacob Vargas is a 33 y.o. 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  He got very fatigued, tired, began having nausea vomiting started throwing up that day.  Reports he thinks he was out in the heat and got overheated and dehydrated  Since then has been having body aches, occasional nausea and vomiting.  Feels like his muscles are very achy.  He is not had any exposure to coronavirus.  Denies fever.  No cough no shortness of breath  He reports he has had a burning acidic feeling in his throat and also upper stomach.  He has never had symptoms like this before.  He reports this is his first day of work and he was exerting himself quite heavily  The skin to have intermittent nausea and vomiting I will keep food on his stomach for the last few days.  He continues to feel fatigued, to the point last night that he almost passed out from being so fatigued and lightheaded  History reviewed. No pertinent past medical history.  There are no active problems to display for this patient.   History reviewed. No pertinent surgical history.  Prior to Admission medications   Not on File    Allergies Patient has no known allergies.  No family history on file.  Social History Social History   Tobacco Use   Smoking status: Never Smoker   Smokeless tobacco: Never Used  Substance Use Topics   Alcohol use: Yes   Drug use: Not on file    Review of Systems Constitutional: No fever/chills Eyes: No visual changes. ENT: No sore throat.  Feels burning like acid Cardiovascular: Denies chest pain. Respiratory: Denies shortness of  breath. Gastrointestinal: Stomach feels crampy nausea frequent vomiting cannot keep food on it. Genitourinary: Negative for dysuria. Musculoskeletal: Muscles aching. Skin: Negative for rash. Neurological: Negative for headaches, areas of focal weakness or numbness.    ____________________________________________   PHYSICAL EXAM:  VITAL SIGNS: ED Triage Vitals  Enc Vitals Group     BP 05/04/19 0959 140/72     Pulse Rate 05/04/19 0959 (!) 136     Resp 05/04/19 0959 16     Temp 05/04/19 0959 99 F (37.2 C)     Temp Source 05/04/19 0959 Oral     SpO2 05/04/19 0959 99 %     Weight 05/04/19 0958 235 lb (106.6 kg)     Height 05/04/19 0958 6' (1.829 m)     Head Circumference --      Peak Flow --      Pain Score 05/04/19 0958 0     Pain Loc --      Pain Edu? --      Excl. in GC? --     Constitutional: Alert and oriented.  Mildly ill-appearing appears nauseated.  Appears generally fatigued but in no distress.  Very pleasant Eyes: Conjunctivae are normal. Head: Atraumatic. Nose: No congestion/rhinnorhea. Mouth/Throat: Mucous membranes are very dry. Neck: No stridor.  Cardiovascular: Slightly tachycardic rate, regular rhythm. Grossly normal heart sounds.  Good peripheral circulation. Respiratory: Normal respiratory effort.  No retractions. Lungs CTAB. Gastrointestinal: Soft and reports moderate tenderness throughout without focal rebound or  guarding. No distention. Musculoskeletal: No lower extremity tenderness nor edema. Neurologic:  Normal speech and language. No gross focal neurologic deficits are appreciated.  Skin:  Skin is warm, dry and intact. No rash noted. Psychiatric: Mood and affect are normal. Speech and behavior are normal.  ____________________________________________   LABS (all labs ordered are listed, but only abnormal results are displayed)  Labs Reviewed  COMPREHENSIVE METABOLIC PANEL - Abnormal; Notable for the following components:      Result Value    Sodium 132 (*)    Potassium 2.8 (*)    Chloride 87 (*)    Glucose, Bld 137 (*)    BUN 44 (*)    Creatinine, Ser 2.44 (*)    Calcium 10.4 (*)    Total Protein 9.4 (*)    Albumin 5.2 (*)    AST 77 (*)    Total Bilirubin 2.3 (*)    GFR calc non Af Amer 34 (*)    GFR calc Af Amer 39 (*)    Anion gap 20 (*)    All other components within normal limits  CBC - Abnormal; Notable for the following components:   WBC 12.3 (*)    Hemoglobin 17.5 (*)    All other components within normal limits  MAGNESIUM - Abnormal; Notable for the following components:   Magnesium 2.8 (*)    All other components within normal limits  CK - Abnormal; Notable for the following components:   Total CK 2,397 (*)    All other components within normal limits  NOVEL CORONAVIRUS, NAA (HOSPITAL ORDER, SEND-OUT TO REF LAB)  LIPASE, BLOOD  LACTIC ACID, PLASMA  URINALYSIS, COMPLETE (UACMP) WITH MICROSCOPIC   ____________________________________________  EKG  Reviewed interpreted me at 10 AM Heart rate 120 QRS 100 QTc 500 Sinus tachycardia, left ventricular hypertrophy. ____________________________________________  RADIOLOGY  Ct Abdomen Pelvis Wo Contrast  Result Date: 05/04/2019 CLINICAL DATA:  Nausea, vomiting, abdominal pain and body aches EXAM: CT ABDOMEN AND PELVIS WITHOUT CONTRAST TECHNIQUE: Multidetector CT imaging of the abdomen and pelvis was performed following the standard protocol without IV contrast. COMPARISON:  None. FINDINGS: Lower chest: No acute abnormality. Hepatobiliary: No solid liver abnormality is seen. No gallstones, gallbladder wall thickening, or biliary dilatation. Pancreas: Unremarkable. No pancreatic ductal dilatation or surrounding inflammatory changes. Spleen: Normal in size without significant abnormality. Adrenals/Urinary Tract: Adrenal glands are unremarkable. Kidneys are normal, without renal calculi, solid lesion, or hydronephrosis. Bladder is unremarkable. Stomach/Bowel:  Stomach is within normal limits. Appendix appears normal. No evidence of bowel wall thickening, distention, or inflammatory changes. Vascular/Lymphatic: No significant vascular findings are present. No enlarged abdominal or pelvic lymph nodes. Reproductive: No mass or other significant abnormality. Other: No abdominal wall hernia or abnormality. No abdominopelvic ascites. Musculoskeletal: No acute or significant osseous findings. IMPRESSION: No non-contrast CT findings of the abdomen pelvis to explain abdominal pain, nausea, vomiting, or body aches. Electronically Signed   By: Lauralyn PrimesAlex  Bibbey M.D.   On: 05/04/2019 12:32    CT noncontrast reviewed negative for acute.  Patient cannot tolerate by mouth due to nausea additionally elevated creatinine ____________________________________________   PROCEDURES  Procedure(s) performed: None  Procedures  Critical Care performed: Yes, see critical care note(s)  CRITICAL CARE Performed by: Sharyn CreamerMark Dontavia Brand   Total critical care time: 30 minutes  Critical care time was exclusive of separately billable procedures and treating other patients.  Critical care was necessary to treat or prevent imminent or life-threatening deterioration.  Critical care was time spent personally by me  on the following activities: development of treatment plan with patient and/or surrogate as well as nursing, discussions with consultants, evaluation of patient's response to treatment, examination of patient, obtaining history from patient or surrogate, ordering and performing treatments and interventions, ordering and review of laboratory studies, ordering and review of radiographic studies, pulse oximetry and re-evaluation of patient's condition.  Patient presents has acute kidney injury, nausea vomiting, noted to have anion gap elevation.  ____________________________________________   INITIAL IMPRESSION / ASSESSMENT AND PLAN / ED COURSE  Pertinent labs & imaging results that  were available during my care of the patient were reviewed by me and considered in my medical decision making (see chart for details).   Patient presents reports started feeling nauseated hot dehydrated after several hours of highly exertional work.  Reports ongoing nausea and vomiting, began developing muscle aches and pains thereafter.  Frequent nausea and vomiting.  Denies fever.  No exposure anyone known to have coronavirus.  Denies diarrhea.  No sore throat.  Does report acidic feeling in his throat however.  No loss of sense of smell. Jacob Vargas was evaluated in Emergency Department on 05/04/2019 for the symptoms described in the history of present illness. He was evaluated in the context of the global COVID-19 pandemic, which necessitated consideration that the patient might be at risk for infection with the SARS-CoV-2 virus that causes COVID-19. Institutional protocols and algorithms that pertain to the evaluation of patients at risk for COVID-19 are in a state of rapid change based on information released by regulatory bodies including the CDC and federal and state organizations. These policies and algorithms were followed during the patient's care in the ED.  Patient does not appear to have symptoms most consistent with COVID, rather I suspect he has developed rhabdo likely heat exhaustion and dehydration secondary to new exertional activity.  He has acute kidney injury hypokalemia, CT scan reassuring without acute.  Elevated anion gap likely due to rhabdo.  Lactic acid normal.  Hydrate, replete electrolytes, pain control  ----------------------------------------- 1:04 PM on 05/04/2019 -----------------------------------------  Patient reports still muscle aches, but pain and nausea are improving with hydration and antiemetics and pain medication.  Fully awake and alert.  Patient agreeable understand plan for admission.  Admitted to hospitalist service Dr. Posey Pronto       ____________________________________________   FINAL CLINICAL IMPRESSION(S) / ED DIAGNOSES  Final diagnoses:  Dehydration after exertion  AKI (acute kidney injury) ([place])  Hypokalemia  Non-traumatic rhabdomyolysis        Note:  This document was prepared using Dragon voice recognition software and may include unintentional dictation errors       Delman Kitten, MD 05/04/19 1304

## 2019-05-05 ENCOUNTER — Inpatient Hospital Stay: Payer: Self-pay

## 2019-05-05 LAB — COMPREHENSIVE METABOLIC PANEL
ALT: 30 U/L (ref 0–44)
AST: 49 U/L — ABNORMAL HIGH (ref 15–41)
Albumin: 3.8 g/dL (ref 3.5–5.0)
Alkaline Phosphatase: 51 U/L (ref 38–126)
Anion gap: 8 (ref 5–15)
BUN: 28 mg/dL — ABNORMAL HIGH (ref 6–20)
CO2: 30 mmol/L (ref 22–32)
Calcium: 9 mg/dL (ref 8.9–10.3)
Chloride: 99 mmol/L (ref 98–111)
Creatinine, Ser: 1.88 mg/dL — ABNORMAL HIGH (ref 0.61–1.24)
GFR calc Af Amer: 54 mL/min — ABNORMAL LOW (ref >60)
GFR calc non Af Amer: 47 mL/min — ABNORMAL LOW (ref >60)
Glucose, Bld: 95 mg/dL (ref 70–99)
Potassium: 3.7 mmol/L (ref 3.5–5.1)
Sodium: 137 mmol/L (ref 135–145)
Total Bilirubin: 2 mg/dL — ABNORMAL HIGH (ref 0.3–1.2)
Total Protein: 6.5 g/dL (ref 6.5–8.1)

## 2019-05-05 LAB — BASIC METABOLIC PANEL
Anion gap: 7 (ref 5–15)
BUN: 24 mg/dL — ABNORMAL HIGH (ref 6–20)
CO2: 29 mmol/L (ref 22–32)
Calcium: 8.9 mg/dL (ref 8.9–10.3)
Chloride: 100 mmol/L (ref 98–111)
Creatinine, Ser: 1.59 mg/dL — ABNORMAL HIGH (ref 0.61–1.24)
GFR calc Af Amer: >60 mL/min (ref >60)
GFR calc non Af Amer: 57 mL/min — ABNORMAL LOW (ref >60)
Glucose, Bld: 91 mg/dL (ref 70–99)
Potassium: 3.6 mmol/L (ref 3.5–5.1)
Sodium: 136 mmol/L (ref 135–145)

## 2019-05-05 LAB — URINALYSIS, COMPLETE (UACMP) WITH MICROSCOPIC
Bacteria, UA: NONE SEEN
Bilirubin Urine: NEGATIVE
Glucose, UA: NEGATIVE mg/dL
Hgb urine dipstick: NEGATIVE
Ketones, ur: 5 mg/dL — AB
Leukocytes,Ua: NEGATIVE
Nitrite: NEGATIVE
Protein, ur: NEGATIVE mg/dL
Specific Gravity, Urine: 1.009 (ref 1.005–1.030)
Squamous Epithelial / LPF: NONE SEEN (ref 0–5)
pH: 6 (ref 5.0–8.0)

## 2019-05-05 LAB — URINE DRUG SCREEN, QUALITATIVE (ARMC ONLY)
Amphetamines, Ur Screen: NOT DETECTED
Barbiturates, Ur Screen: NOT DETECTED
Benzodiazepine, Ur Scrn: NOT DETECTED
Cannabinoid 50 Ng, Ur ~~LOC~~: POSITIVE — AB
Cocaine Metabolite,Ur ~~LOC~~: NOT DETECTED
MDMA (Ecstasy)Ur Screen: NOT DETECTED
Methadone Scn, Ur: NOT DETECTED
Opiate, Ur Screen: POSITIVE — AB
Phencyclidine (PCP) Ur S: NOT DETECTED
Tricyclic, Ur Screen: NOT DETECTED

## 2019-05-05 LAB — CBC
HCT: 39.9 % (ref 39.0–52.0)
Hemoglobin: 13.6 g/dL (ref 13.0–17.0)
MCH: 30.4 pg (ref 26.0–34.0)
MCHC: 34.1 g/dL (ref 30.0–36.0)
MCV: 89.1 fL (ref 80.0–100.0)
Platelets: 279 10*3/uL (ref 150–400)
RBC: 4.48 MIL/uL (ref 4.22–5.81)
RDW: 12.2 % (ref 11.5–15.5)
WBC: 9.5 10*3/uL (ref 4.0–10.5)
nRBC: 0 % (ref 0.0–0.2)

## 2019-05-05 LAB — CK
Total CK: 997 U/L — ABNORMAL HIGH (ref 49–397)
Total CK: 781 U/L — ABNORMAL HIGH (ref 49–397)

## 2019-05-05 MED ORDER — CYCLOBENZAPRINE HCL 10 MG PO TABS
5.0000 mg | ORAL_TABLET | Freq: Three times a day (TID) | ORAL | Status: DC
  Administered 2019-05-05 – 2019-05-06 (×4): 5 mg via ORAL
  Filled 2019-05-05 (×4): qty 1

## 2019-05-05 NOTE — TOC Initial Note (Signed)
Transition of Care [facility]) - Initial/Assessment Note    Patient Details  Name: Jacob Vargas MRN: [id number] Date of Birth: [birth year]  [facility] CM/SW Contact:    Mazy Culton, Lenice Llamas Phone Number: [phone]  05/05/2019, 3:31 PM  Clinical Narrative: Clinical Social Worker (CSW) met with patient to provide resources. Patient was alert and oriented X4 and was laying in the bed. CSW introduced self and explained role of CSW department. Per patient he lives alone in [place] and works at [employer] in [place]. 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 Patient reported that that he does not have insurance and is agreeable to going to [facility] to establish a PCP. Per patient he does not have problems affording his medications. CSW provided patient [facility] and Medication Management application. CSW also emailed Lurena Nida at [employer] with patient's permission. Patient reported no other needs or concerns. CSW will continue to follow and assist as needed.              Expected Discharge Plan: Home/Self Care Barriers to Discharge: Continued Medical Work up   Patient Goals and CMS Choice        Expected Discharge Plan and Services Expected Discharge Plan: Home/Self Care In-house Referral: Clinical Social Work     Living arrangements for the past 2 months: Single Family Home Expected Discharge Date: 05/06/19                                    Prior Living Arrangements/Services Living arrangements for the past 2 months: Single Family Home Lives with:: Self Patient language and need for interpreter reviewed:: No Do you feel safe going back to the place where you live?: Yes      Need for Family Participation in Patient Care: No (Comment) Care giver support system in place?: No (comment)   Criminal Activity/Legal Involvement Pertinent to Current Situation/Hospitalization:  No - Comment as needed  Activities of Daily Living Home Assistive Devices/Equipment: None ADL Screening (condition at time of admission) Patient's cognitive ability adequate to safely complete daily activities?: Yes Is the patient deaf or have difficulty hearing?: No Does the patient have difficulty seeing, even when wearing glasses/contacts?: No Does the patient have difficulty concentrating, remembering, or making decisions?: No Patient able to express need for assistance with ADLs?: Yes Does the patient have difficulty dressing or bathing?: No Independently performs ADLs?: Yes (appropriate for developmental age) Does the patient have difficulty walking or climbing stairs?: No Weakness of Legs: Both Weakness of Arms/Hands: Both  Permission Sought/Granted Permission sought to share information with : Other (comment)([facility]) Permission granted to share information with : Yes, Verbal Permission Granted              Emotional Assessment Appearance:: Appears stated age   Affect (typically observed): Pleasant, Calm Orientation: : Oriented to Self, Oriented to Place, Oriented to  Time, Oriented to Situation Alcohol / Substance Use: Not Applicable Psych Involvement: No (comment)  Admission diagnosis:  Hypokalemia [E87.6] AKI (acute kidney injury) ([place]) [N17.9] Non-traumatic rhabdomyolysis [M62.82] Dehydration after exertion [E86.0] Patient Active Problem List   Diagnosis Date Noted   Rhabdomyolysis 05/04/2019   PCP:  Patient, No Pcp Per Pharmacy:   [id number]  [place] Phone: [phone] Fax: [phone]     Social Determinants of Health (SDOH) Interventions    Readmission Risk Interventions No flowsheet data found.

## 2019-05-05 NOTE — Progress Notes (Signed)
PHARMACY CONSULT NOTE - FOLLOW UP  Pharmacy Consult for Electrolyte Monitoring and Replacement   Recent Labs: Potassium (mmol/L)  Date Value  05/05/2019 3.7   Magnesium (mg/dL)  Date Value  05/04/2019 2.8 (H)   Calcium (mg/dL)  Date Value  05/05/2019 9.0   Albumin (g/dL)  Date Value  05/05/2019 3.8   Sodium (mmol/L)  Date Value  05/05/2019 137     Assessment: 33 year old male presented with N/V, rhabdo. Potassium on admission 2.8.  Goal of Therapy:  K ~ 4 Mg ~ 2  Plan:  K 3.7  Scr 1.88. (Mag 2.8 on 7/13) No supplementation at this time. Patient was dehydrated. CK improved F/u am labs Pharmacy to continue to follow and replace electrolytes as needed.  Noralee Space ,PharmD Clinical Pharmacist 05/05/2019 8:08 AM

## 2019-05-05 NOTE — Progress Notes (Signed)
[place] [facility] [place] NAME: Jacob Vargas    MR#:  [id number]  DATE OF BIRTH:  07-12-1986  SUBJECTIVE:  CHIEF COMPLAINT:   Chief Complaint  Patient presents with   Emesis   Came with muscle weakness nausea and vomiting Elevated CK level and renal failure.  Still have significant pain.  REVIEW OF SYSTEMS:  CONSTITUTIONAL: No fever, fatigue or weakness.  EYES: No blurred or double vision.  EARS, NOSE, AND THROAT: No tinnitus or ear pain.  RESPIRATORY: No cough, shortness of breath, wheezing or hemoptysis.  CARDIOVASCULAR: No chest pain, orthopnea, edema.  GASTROINTESTINAL: No nausea, vomiting, diarrhea or abdominal pain.  GENITOURINARY: No dysuria, hematuria.  ENDOCRINE: No polyuria, nocturia,  HEMATOLOGY: No anemia, easy bruising or bleeding SKIN: No rash or lesion. MUSCULOSKELETAL: No joint pain or arthritis.   NEUROLOGIC: No tingling, have numbness, weakness.  PSYCHIATRY: No anxiety or depression.   ROS  DRUG ALLERGIES:  No Known Allergies  VITALS:  Blood pressure 133/85, pulse 96, temperature 98.4 F (36.9 C), temperature source Oral, resp. rate 20, height 6' (1.829 m), weight 106.6 kg, SpO2 100 %.  PHYSICAL EXAMINATION:  GENERAL:  33 y.o.-year-old patient lying in the bed with no acute distress.  EYES: Pupils equal, round, reactive to light and accommodation. No scleral icterus. Extraocular muscles intact.  HEENT: Head atraumatic, normocephalic. Oropharynx and nasopharynx clear.  NECK:  Supple, no jugular venous distention. No thyroid enlargement, no tenderness.  LUNGS: Normal breath sounds bilaterally, no wheezing, rales,rhonchi or crepitation. No use of accessory muscles of respiration.  CARDIOVASCULAR: S1, S2 normal. No murmurs, rubs, or gallops.  ABDOMEN: Soft, left flank tender, nondistended. Bowel sounds present. No organomegaly or mass.  EXTREMITIES: No pedal edema, cyanosis, or clubbing.  NEUROLOGIC: Cranial nerves  II through XII are intact. Muscle strength 5/5 in all extremities. Sensation intact. Gait not checked.  PSYCHIATRIC: The patient is alert and oriented x 3.  SKIN: No obvious rash, lesion, or ulcer.   Physical Exam LABORATORY PANEL:   CBC Recent Labs  Lab 05/05/19 0407  WBC 9.5  HGB 13.6  HCT 39.9  PLT 279   ------------------------------------------------------------------------------------------------------------------  Chemistries  Recent Labs  Lab 05/04/19 1006 05/05/19 0407 05/05/19 1150  NA 132* 137 136  K 2.8* 3.7 3.6  CL 87* 99 100  CO2 25 30 29   GLUCOSE 137* 95 91  BUN 44* 28* 24*  CREATININE 2.44* 1.88* 1.59*  CALCIUM 10.4* 9.0 8.9  MG 2.8*  --   --   AST 77* 49*  --   ALT 40 30  --   ALKPHOS 70 51  --   BILITOT 2.3* 2.0*  --    ------------------------------------------------------------------------------------------------------------------  Cardiac Enzymes No results for input(s): TROPONINI in the last 168 hours. ------------------------------------------------------------------------------------------------------------------  RADIOLOGY:  Ct Abdomen Pelvis Wo Contrast  Result Date: 05/04/2019 CLINICAL DATA:  Nausea, vomiting, abdominal pain and body aches EXAM: CT ABDOMEN AND PELVIS WITHOUT CONTRAST TECHNIQUE: Multidetector CT imaging of the abdomen and pelvis was performed following the standard protocol without IV contrast. COMPARISON:  None. FINDINGS: Lower chest: No acute abnormality. Hepatobiliary: No solid liver abnormality is seen. No gallstones, gallbladder wall thickening, or biliary dilatation. Pancreas: Unremarkable. No pancreatic ductal dilatation or surrounding inflammatory changes. Spleen: Normal in size without significant abnormality. Adrenals/Urinary Tract: Adrenal glands are unremarkable. Kidneys are normal, without renal calculi, solid lesion, or hydronephrosis. Bladder is unremarkable. Stomach/Bowel: Stomach is within normal limits.  Appendix appears normal. No evidence of bowel  wall thickening, distention, or inflammatory changes. Vascular/Lymphatic: No significant vascular findings are present. No enlarged abdominal or pelvic lymph nodes. Reproductive: No mass or other significant abnormality. Other: No abdominal wall hernia or abnormality. No abdominopelvic ascites. Musculoskeletal: No acute or significant osseous findings. IMPRESSION: No non-contrast CT findings of the abdomen pelvis to explain abdominal pain, nausea, vomiting, or body aches. Electronically Signed   By: Lauralyn PrimesAlex  Bibbey M.D.   On: 05/04/2019 12:32   [place] Renal  Result Date: 05/05/2019 CLINICAL DATA:  Acute renal failure. EXAM: RENAL / URINARY TRACT ULTRASOUND COMPLETE COMPARISON:  None. FINDINGS: Right Kidney: Renal measurements: 11.3 x 6 x 6.3 cm = volume: 220 mL . Echogenicity within normal limits. No mass or hydronephrosis visualized. Left Kidney: Renal measurements: 12.1 x 6.4 x 6.1 cm = volume: 249 mL. Echogenicity within normal limits. No mass or hydronephrosis visualized. Bladder: Appears normal for degree of bladder distention. Both distal ureters are shown to be patent at the level of the bladder (bilateral ureteral jets visualized). IMPRESSION: Normal renal ultrasound. Electronically Signed   By: Bary RichardStan  Maynard M.D.   On: 05/05/2019 12:14    ASSESSMENT AND PLAN:   Active Problems:   Rhabdomyolysis  *Acute rhabdomyolysis Elevated CK level, coming down with IV fluids now.,  Continue to monitor CK and renal function.  *Acute renal failure due to rhabdomyolysis Improving with IV fluids.  As patient have left flank pain, will check renal ultrasound.  *Nausea and vomiting Supportive care.    All the records are reviewed and case discussed with Care Management/Social Workerr. Management plans discussed with the patient, family and they are in agreement.  CODE STATUS: Full.  TOTAL TIME TAKING CARE OF THIS PATIENT: 35 minutes.     POSSIBLE D/C IN  1-2 DAYS, DEPENDING ON CLINICAL CONDITION.   Altamese DillingVaibhavkumar Jancarlo Biermann M.D on 05/05/2019   Between 7am to 6pm - Pager - [phone]  After 6pm go to [email or web address] - password [email or web address]  [facility] [place] [facility]  Office  [phone]  CC: Primary care physician; Patient, No Pcp Per  Note: This dictation was prepared with Dragon dictation along with smaller phrase technology. Any transcriptional errors that result from this process are unintentional.

## 2019-05-06 ENCOUNTER — Telehealth: Payer: Self-pay | Admitting: Adult Health Nurse Practitioner

## 2019-05-06 LAB — CK: Total CK: 426 U/L — ABNORMAL HIGH (ref 49–397)

## 2019-05-06 LAB — BASIC METABOLIC PANEL
Anion gap: 6 (ref 5–15)
BUN: 18 mg/dL (ref 6–20)
CO2: 31 mmol/L (ref 22–32)
Calcium: 8.7 mg/dL — ABNORMAL LOW (ref 8.9–10.3)
Chloride: 103 mmol/L (ref 98–111)
Creatinine, Ser: 1.33 mg/dL — ABNORMAL HIGH (ref 0.61–1.24)
GFR calc Af Amer: >60 mL/min (ref >60)
GFR calc non Af Amer: >60 mL/min (ref >60)
Glucose, Bld: 91 mg/dL (ref 70–99)
Potassium: 3.3 mmol/L — ABNORMAL LOW (ref 3.5–5.1)
Sodium: 140 mmol/L (ref 135–145)

## 2019-05-06 LAB — HIV ANTIBODY (ROUTINE TESTING W REFLEX): HIV Screen 4th Generation wRfx: NONREACTIVE

## 2019-05-06 MED ORDER — CYCLOBENZAPRINE HCL 5 MG PO TABS: 5.0000 mg | ORAL_TABLET | Freq: Three times a day (TID) | ORAL | 0 refills | Status: DC

## 2019-05-06 MED ORDER — POTASSIUM CHLORIDE CRYS ER 20 MEQ PO TBCR
20.0000 meq | EXTENDED_RELEASE_TABLET | Freq: Once | ORAL | Status: AC
  Administered 2019-05-06: 09:00:00 20 meq via ORAL
  Filled 2019-05-06: qty 1

## 2019-05-06 NOTE — Progress Notes (Signed)
PHARMACY CONSULT NOTE - FOLLOW UP  Pharmacy Consult for Electrolyte Monitoring and Replacement   Recent Labs: Potassium (mmol/L)  Date Value  05/06/2019 3.3 (L)   Magnesium (mg/dL)  Date Value  05/04/2019 2.8 (H)   Calcium (mg/dL)  Date Value  05/06/2019 8.7 (L)   Albumin (g/dL)  Date Value  05/05/2019 3.8   Sodium (mmol/L)  Date Value  05/06/2019 140     Assessment: 33 year old male presented with N/V, rhabdo. Potassium on admission 2.8.  Goal of Therapy:  K ~ 4 Mg ~ 2  Plan:  K 3.3  Scr 1.33. (Mag 2.8 on 7/13) Will order KCL 20 meq PO x 1. Patient with nausea/vomiting/dehydrated. CK improved. F/u am labs Pharmacy to continue to follow and replace electrolytes as needed.  Noralee Space ,PharmD Clinical Pharmacist 05/06/2019 7:53 AM

## 2019-05-06 NOTE — Discharge Summary (Signed)
[facility] - [place] at [facility]   PATIENT NAME: Jacob Vargas    MR#:  [id number]  DATE OF BIRTH:  [birth year]  DATE OF ADMISSION:  05/04/2019 ADMITTING PHYSICIAN: Auburn Bilberry, MD  DATE OF DISCHARGE: 05/06/2019  1:53 PM  PRIMARY CARE PHYSICIAN: Patient, No Pcp Per    ADMISSION DIAGNOSIS:  Hypokalemia [E87.6] AKI (acute kidney injury) (HCC) [N17.9] Non-traumatic rhabdomyolysis [M62.82] Dehydration after exertion [E86.0]  DISCHARGE DIAGNOSIS:  Active Problems:   Rhabdomyolysis   SECONDARY DIAGNOSIS:   Past Medical History:  Diagnosis Date  . Patient denies medical problems     HOSPITAL COURSE:   *Acute rhabdomyolysis Elevated CK level, coming down with IV fluids now.,  Continue to monitor CK and renal function.  Improved.  *Acute renal failure due to rhabdomyolysis Improving with IV fluids.  As patient have left flank pain, will check renal ultrasound.  *Nausea and vomiting Supportive care.   DISCHARGE CONDITIONS:   Stable.  CONSULTS OBTAINED:    DRUG ALLERGIES:  No Known Allergies  DISCHARGE MEDICATIONS:   Allergies as of 05/06/2019   No Known Allergies     Medication List    TAKE these medications   cyclobenzaprine 5 MG tablet Commonly known as: FLEXERIL Take 1 tablet (5 mg total) by mouth 3 (three) times daily.        DISCHARGE INSTRUCTIONS:   Follow with PMD in 1-2 weeks.  If you experience worsening of your admission symptoms, develop shortness of breath, life threatening emergency, suicidal or homicidal thoughts you must seek medical attention immediately by calling 911 or calling your MD immediately  if symptoms less severe.  You Must read complete instructions/literature along with all the possible adverse reactions/side effects for all the Medicines you take and that have been prescribed to you. Take any new Medicines after you have completely understood and accept all the possible adverse  reactions/side effects.   Please note  You were cared for by a hospitalist during your hospital stay. If you have any questions about your discharge medications or the care you received while you were in the hospital after you are discharged, you can call the unit and asked to speak with the hospitalist on call if the hospitalist that took care of you is not available. Once you are discharged, your primary care physician will handle any further medical issues. Please note that NO REFILLS for any discharge medications will be authorized once you are discharged, as it is imperative that you return to your primary care physician (or establish a relationship with a primary care physician if you do not have one) for your aftercare needs so that they can reassess your need for medications and monitor your lab values.    Today   CHIEF COMPLAINT:   Chief Complaint  Patient presents with  . Emesis    HISTORY OF PRESENT ILLNESS:  Jacob Vargas  is a 33 y.o. male with no medical history who is presenting to the ER with complaint of having nausea vomiting and generalized body aches for the past few days.  Patient states that he works for [employer] and he lifted some furniture few days ago in the heat and subsequently started having generalized body aches and severe nausea and vomiting.  Patient states that he has not had any fevers but has had chills.  In the emergency room he was noted to have rhabdomyolysis as well as acute renal failure.  He denies any chest pain or palpitations.  VITAL SIGNS:  Blood pressure 120/88, pulse 69, temperature 98.6 F (37 C), temperature source Oral, resp. rate 18, height 6' (1.829 m), weight 106.6 kg, SpO2 100 %.  I/O:  No intake or output data in the 24 hours ending 05/08/19 1619  PHYSICAL EXAMINATION:  GENERAL:  33 y.o.-year-old patient lying in the bed with no acute distress.  EYES: Pupils equal, round, reactive to light and accommodation. No scleral  icterus. Extraocular muscles intact.  HEENT: Head atraumatic, normocephalic. Oropharynx and nasopharynx clear.  NECK:  Supple, no jugular venous distention. No thyroid enlargement, no tenderness.  LUNGS: Normal breath sounds bilaterally, no wheezing, rales,rhonchi or crepitation. No use of accessory muscles of respiration.  CARDIOVASCULAR: S1, S2 normal. No murmurs, rubs, or gallops.  ABDOMEN: Soft, left flank tender, nondistended. Bowel sounds present. No organomegaly or mass.  EXTREMITIES: No pedal edema, cyanosis, or clubbing.  NEUROLOGIC: Cranial nerves II through XII are intact. Muscle strength 5/5 in all extremities. Sensation intact. Gait not checked.  PSYCHIATRIC: The patient is alert and oriented x 3.  SKIN: No obvious rash, lesion, or ulcer.    DATA REVIEW:   CBC Recent Labs  Lab 05/05/19 0407  WBC 9.5  HGB 13.6  HCT 39.9  PLT 279    Chemistries  Recent Labs  Lab 05/04/19 1006 05/05/19 0407  05/06/19 0314  NA 132* 137   < > 140  K 2.8* 3.7   < > 3.3*  CL 87* 99   < > 103  CO2 25 30   < > 31  GLUCOSE 137* 95   < > 91  BUN 44* 28*   < > 18  CREATININE 2.44* 1.88*   < > 1.33*  CALCIUM 10.4* 9.0   < > 8.7*  MG 2.8*  --   --   --   AST 77* 49*  --   --   ALT 40 30  --   --   ALKPHOS 70 51  --   --   BILITOT 2.3* 2.0*  --   --    < > = values in this interval not displayed.    Cardiac Enzymes No results for input(s): TROPONINI in the last 168 hours.  Microbiology Results  Results for orders placed or performed during the hospital encounter of 05/04/19  SARS Coronavirus 2 (CEPHEID- Performed in [facility] hospital lab), Hosp Order     Status: None   Collection Time: 05/04/19  1:17 PM   Specimen: Nasopharyngeal Swab  Result Value Ref Range Status   SARS Coronavirus 2 NEGATIVE NEGATIVE Final    Comment: (NOTE) If result is NEGATIVE SARS-CoV-2 target nucleic acids are NOT DETECTED. The SARS-CoV-2 RNA is generally detectable in upper and lower  respiratory  specimens during the acute phase of infection. The lowest  concentration of SARS-CoV-2 viral copies this assay can detect is 250  copies / mL. A negative result does not preclude SARS-CoV-2 infection  and should not be used as the sole basis for treatment or other  patient management decisions.  A negative result may occur with  improper specimen collection / handling, submission of specimen other  than nasopharyngeal swab, presence of viral mutation(s) within the  areas targeted by this assay, and inadequate number of viral copies  (<250 copies / mL). A negative result must be combined with clinical  observations, patient history, and epidemiological information. If result is POSITIVE SARS-CoV-2 target nucleic acids are DETECTED. The SARS-CoV-2 RNA is generally detectable in upper and  lower  respiratory specimens dur ing the acute phase of infection.  Positive  results are indicative of active infection with SARS-CoV-2.  Clinical  correlation with patient history and other diagnostic information is  necessary to determine patient infection status.  Positive results do  not rule out bacterial infection or co-infection with other viruses. If result is PRESUMPTIVE POSTIVE SARS-CoV-2 nucleic acids MAY BE PRESENT.   A presumptive positive result was obtained on the submitted specimen  and confirmed on repeat testing.  While 2019 novel coronavirus  (SARS-CoV-2) nucleic acids may be present in the submitted sample  additional confirmatory testing may be necessary for epidemiological  and / or clinical management purposes  to differentiate between  SARS-CoV-2 and other Sarbecovirus currently known to infect humans.  If clinically indicated additional testing with an alternate test  methodology [id number]) is advised. The SARS-CoV-2 RNA is generally  detectable in upper and lower respiratory sp ecimens during the acute  phase of infection. The expected result is Negative. Fact Sheet for  Patients:  BoilerBrush.com.cy Fact Sheet for Healthcare Providers: https://pope.com/ This test is not yet approved or cleared by the [place] FDA and has been authorized for detection and/or diagnosis of SARS-CoV-2 by FDA under an Emergency Use Authorization (EUA).  This EUA will remain in effect (meaning this test can be used) for the duration of the COVID-19 declaration under Section 564(b)(1) of the Act, 21 U.S.C. section 360bbb-3(b)(1), unless the authorization is terminated or revoked sooner. Performed at [facility], [address], [place], [place] [address]     RADIOLOGY:  No results found.  EKG:   Orders placed or performed during the hospital encounter of 05/04/19  . EKG      Management plans discussed with the patient, family and they are in agreement.  CODE STATUS:  Code Status History    Date Active Date Inactive Code Status Order ID Comments User Context   05/04/2019 1716 05/06/2019 1658 Full Code [id number]  Auburn Bilberry, MD Inpatient   Advance Care Planning Activity      TOTAL TIME TAKING CARE OF THIS PATIENT: 35 minutes.    Altamese Dilling M.D on 05/08/2019 at 4:19 PM  Between 7am to 6pm - Pager - [phone]  After 6pm go to [email or web address] - password [email or web address]  [facility]  Office  [phone]  CC: Primary care physician; Patient, No Pcp Per   Note: This dictation was prepared with Dragon dictation along with smaller phrase technology. Any transcriptional errors that result from this process are unintentional.

## 2019-05-06 NOTE — Progress Notes (Addendum)
Pt being discharged home, discharge instructions reviewed with pt, states understanding, advised to stay hydrated, pt with no complaints at discharge, work not given, refused wheelchair for discharge

## 2019-05-06 NOTE — Telephone Encounter (Signed)
-----   Message from Marcie Mowers sent at 05/06/2019 10:55 AM EDT ----- Regarding: New Patient interview Contact: [phone] Please call patient a referral from [facility].

## 2019-05-06 NOTE — Discharge Instructions (Signed)
Next 2-3 days drink a lot of water and do less straneous activities.

## 2019-05-06 NOTE — Progress Notes (Signed)
[place], [place].   05/06/2019  Patient: Jacob Vargas   Date of Birth:  April 05, 1986  Date of admission:  05/04/2019  Date of Discharge  05/06/2019    To Whom it May Concern:   Jerral Kerlin  may return to work on 05/10/19.  PHYSICAL ACTIVITY:  Full  If you have any questions or concerns, please don't hesitate to call.  Sincerely,   Vaughan Basta M.D Pager Number978 [phone] Office : [phone]   .

## 2019-07-08 ENCOUNTER — Emergency Department
Admission: EM | Admit: 2019-07-08 | Discharge: 2019-07-08 | Disposition: A | Discharge status: Home / Self Care | Payer: Self-pay | Attending: Emergency Medicine | Admitting: Emergency Medicine

## 2019-07-08 ENCOUNTER — Other Ambulatory Visit: Payer: Self-pay

## 2019-07-08 ENCOUNTER — Encounter: Payer: Self-pay | Admitting: Emergency Medicine

## 2019-07-08 DIAGNOSIS — R300 Dysuria: Secondary | ICD-10-CM

## 2019-07-08 DIAGNOSIS — Z202 Contact with and (suspected) exposure to infections with a predominantly sexual mode of transmission: Secondary | ICD-10-CM | POA: Insufficient documentation

## 2019-07-08 DIAGNOSIS — A64 Unspecified sexually transmitted disease: Secondary | ICD-10-CM

## 2019-07-08 LAB — URINALYSIS, COMPLETE (UACMP) WITH MICROSCOPIC
Bacteria, UA: NONE SEEN
Bilirubin Urine: NEGATIVE
Glucose, UA: NEGATIVE mg/dL
Hgb urine dipstick: NEGATIVE
Ketones, ur: 5 mg/dL — AB
Leukocytes,Ua: NEGATIVE
Nitrite: NEGATIVE
Protein, ur: 30 mg/dL — AB
Specific Gravity, Urine: 1.033 — ABNORMAL HIGH (ref 1.005–1.030)
Squamous Epithelial / HPF: NONE SEEN (ref 0–5)
pH: 5 (ref 5.0–8.0)

## 2019-07-08 MED ORDER — LIDOCAINE HCL (PF) 1 % IJ SOLN
5.0000 mL | Freq: Once | INTRAMUSCULAR | Status: AC
  Administered 2019-07-08: 5 mL via INTRADERMAL
  Filled 2019-07-08: qty 5

## 2019-07-08 MED ORDER — CEFTRIAXONE SODIUM 250 MG IJ SOLR
250.0000 mg | Freq: Once | INTRAMUSCULAR | Status: AC
  Administered 2019-07-08: 250 mg via INTRAMUSCULAR
  Filled 2019-07-08: qty 250

## 2019-07-08 MED ORDER — AZITHROMYCIN 500 MG PO TABS
1000.0000 mg | ORAL_TABLET | Freq: Once | ORAL | Status: AC
  Administered 2019-07-08: 1000 mg via ORAL
  Filled 2019-07-08: qty 2

## 2019-07-08 NOTE — ED Provider Notes (Signed)
[facility] Emergency Department Provider Note  ____________________________________________   First MD Initiated Contact with Patient 07/08/19 1555     (approximate)  I have reviewed the triage vital signs and the nursing notes.   HISTORY  Chief Complaint Dysuria    HPI Jacob Vargas is a 33 y.o. male presents emergency department complaint of frequency and burning with urination.  Patient had unprotected sex 2 weeks ago and has been masturbating frequently.  He states that he was with a woman who states she had blisters in her mouth and a dental abscess.  She was seen at [facility] and given hydrocodone.  He is afraid that he has a bacterial infection from her.  He denies any other complaints.  No fever or chills.  He denies penile discharge at this time.    Past Medical History:  Diagnosis Date   Patient denies medical problems     Patient Active Problem List   Diagnosis Date Noted   Rhabdomyolysis 05/04/2019    Past Surgical History:  Procedure Laterality Date   pt denies      Prior to Admission medications   Medication Sig Start Date End Date Taking? Authorizing Provider  cyclobenzaprine (FLEXERIL) 5 MG tablet Take 1 tablet (5 mg total) by mouth 3 (three) times daily. 05/06/19   Altamese DillingVachhani, Vaibhavkumar, MD    Allergies Patient has no known allergies.  Family History  Problem Relation Age of Onset   Diabetes Mother     Social History Social History   Tobacco Use   Smoking status: Never Smoker   Smokeless tobacco: Never Used  Substance Use Topics   Alcohol use: Yes    Comment: occasionaly   Drug use: Yes    Types: Marijuana    Review of Systems  Constitutional: No fever/chills Eyes: No visual changes. ENT: No sore throat. Respiratory: Denies cough Genitourinary: Positive for dysuria. Musculoskeletal: Negative for back pain. Skin: Negative for  rash.    ____________________________________________   PHYSICAL EXAM:  VITAL SIGNS: ED Triage Vitals  Enc Vitals Group     BP 07/08/19 1548 (!) 156/95     Pulse --      Resp 07/08/19 1548 16     Temp 07/08/19 1548 98.4 F (36.9 C)     Temp Source 07/08/19 1548 Oral     SpO2 07/08/19 1548 100 %     Weight 07/08/19 1547 235 lb 0.2 oz (106.6 kg)     Height --      Head Circumference --      Peak Flow --      Pain Score 07/08/19 1547 0     Pain Loc --      Pain Edu? --      Excl. in GC? --     Constitutional: Alert and oriented. Well appearing and in no acute distress. Eyes: Conjunctivae are normal.  Head: Atraumatic. Nose: No congestion/rhinnorhea. Mouth/Throat: Mucous membranes are moist.   Cardiovascular: Normal rate, regular rhythm.  Respiratory: Normal respiratory effort.  No retractions,  GU: No penile discharge noted.  No sores or lesions noted. However, the patient does have a yellow discharge noted in his underwear Musculoskeletal: FROM all extremities, warm and well perfused Neurologic:  Normal speech and language.  Skin:  Skin is warm, dry and intact. No rash noted. Psychiatric: Mood and affect are normal. Speech and behavior are normal.  ____________________________________________   LABS (all labs ordered are listed, but only abnormal results are displayed)  Labs Reviewed  URINALYSIS, COMPLETE (UACMP) WITH MICROSCOPIC - Abnormal; Notable for the following components:      Result Value   Color, Urine YELLOW (*)    APPearance CLEAR (*)    Specific Gravity, Urine 1.033 (*)    Ketones, ur 5 (*)    Protein, ur 30 (*)    All other components within normal limits  GC/CHLAMYDIA PROBE AMP   ____________________________________________   ____________________________________________  RADIOLOGY    ____________________________________________   PROCEDURES  Procedure(s) performed: rocephin 250mg  im, zithromax 1g  po  Procedures    ____________________________________________   INITIAL IMPRESSION / ASSESSMENT AND PLAN / ED COURSE  Pertinent labs & imaging results that were available during my care of the patient were reviewed by me and considered in my medical decision making (see chart for details).   Patient is 33 year old male presents emergency department with complaints of dysuria.  History of unprotected sex.  Physical exam shows patient to appear well.  Exam is unremarkable  UA and GC/chlamydia tests ordered.  UA is normal, GC/chlamydia is still pending  On further discussion with the patient he shows me some yellow discharge in his underwear.  At this time I feel he probably has a STD.  He was treated with Rocephin 250 mg IM and Zithromax 1 g p.o.  He is to notify his partner once his test results have been received.  No sex with anyone until he is been treated for 7 to 10 days.  Explained to him that he needs to wear a condom.  He states he understands and will try to comply.  He was discharged in stable condition.    Jacob Vargas was evaluated in Emergency Department on 07/08/2019 for the symptoms described in the history of present illness. He was evaluated in the context of the global COVID-19 pandemic, which necessitated consideration that the patient might be at risk for infection with the SARS-CoV-2 virus that causes COVID-19. Institutional protocols and algorithms that pertain to the evaluation of patients at risk for COVID-19 are in a state of rapid change based on information released by regulatory bodies including the CDC and federal and state organizations. These policies and algorithms were followed during the patient's care in the ED.   As part of my medical decision making, I reviewed the following data within the [place] notes reviewed and incorporated, Labs reviewed UA is normal, Old chart reviewed, Notes from prior ED visits and [place]  Controlled Substance Database  ____________________________________________   FINAL CLINICAL IMPRESSION(S) / ED DIAGNOSES  Final diagnoses:  STD (male)  Dysuria      NEW MEDICATIONS STARTED DURING THIS VISIT:  New Prescriptions   No medications on file     Note:  This document was prepared using Dragon voice recognition software and may include unintentional dictation errors.    Versie Starks, PA-C 07/08/19 1657    Blake Divine, MD 07/08/19 [phone]

## 2019-07-08 NOTE — ED Notes (Signed)
Patient c/o frequency and burning with urination. Patient states he was masturbating frequently and then had sex with a woman who had "blisters" in her mouth.

## 2019-07-08 NOTE — Discharge Instructions (Signed)
Follow-up with your regular doctor or the [facility] for evaluation of HIV and syphilis.  Please notify any partners that you have had recently that you may have an STD.  Your results should be back to the emergency department in approximately 2 days.  A nurse will call you with results.  If you have not heard from [place] by Monday of next week please call to receive your test results.

## 2019-07-08 NOTE — ED Triage Notes (Signed)
C/o urinary frequency, urgency, and burning x 4 days.

## 2019-07-10 ENCOUNTER — Other Ambulatory Visit: Payer: Self-pay

## 2019-07-10 ENCOUNTER — Emergency Department
Admission: EM | Admit: 2019-07-10 | Discharge: 2019-07-10 | Disposition: A | Discharge status: Home / Self Care | Payer: Self-pay | Attending: Student in an Organized Health Care Education/Training Program | Admitting: Student in an Organized Health Care Education/Training Program

## 2019-07-10 ENCOUNTER — Emergency Department: Payer: Self-pay

## 2019-07-10 DIAGNOSIS — F1210 Cannabis abuse, uncomplicated: Secondary | ICD-10-CM | POA: Insufficient documentation

## 2019-07-10 DIAGNOSIS — R319 Hematuria, unspecified: Secondary | ICD-10-CM | POA: Insufficient documentation

## 2019-07-10 DIAGNOSIS — R300 Dysuria: Secondary | ICD-10-CM | POA: Insufficient documentation

## 2019-07-10 LAB — URINALYSIS, COMPLETE (UACMP) WITH MICROSCOPIC
Bacteria, UA: NONE SEEN
Bilirubin Urine: NEGATIVE
Glucose, UA: NEGATIVE mg/dL
Hgb urine dipstick: NEGATIVE
Ketones, ur: 20 mg/dL — AB
Leukocytes,Ua: NEGATIVE
Nitrite: NEGATIVE
Protein, ur: 30 mg/dL — AB
Specific Gravity, Urine: 1.028 (ref 1.005–1.030)
pH: 5 (ref 5.0–8.0)

## 2019-07-10 MED ORDER — CEPHALEXIN 500 MG PO CAPS
500.0000 mg | ORAL_CAPSULE | Freq: Once | ORAL | Status: AC
  Administered 2019-07-10: 500 mg via ORAL
  Filled 2019-07-10: qty 1

## 2019-07-10 MED ORDER — DOXYCYCLINE HYCLATE 100 MG PO TABS
100.0000 mg | ORAL_TABLET | Freq: Once | ORAL | Status: AC
  Administered 2019-07-10: 100 mg via ORAL
  Filled 2019-07-10: qty 1

## 2019-07-10 MED ORDER — DOXYCYCLINE HYCLATE 50 MG PO CAPS: 100.0000 mg | ORAL_CAPSULE | Freq: Two times a day (BID) | ORAL | 0 refills | Status: AC

## 2019-07-10 MED ORDER — CEPHALEXIN 500 MG PO CAPS: 500.0000 mg | ORAL_CAPSULE | Freq: Two times a day (BID) | ORAL | 0 refills | Status: AC

## 2019-07-10 MED ORDER — METRONIDAZOLE 500 MG PO TABS
2000.0000 mg | ORAL_TABLET | Freq: Once | ORAL | Status: AC
  Administered 2019-07-10: 2000 mg via ORAL
  Filled 2019-07-10: qty 4

## 2019-07-10 NOTE — ED Provider Notes (Signed)
[facility] Emergency Department Provider Note  ____________________________________________  Time seen: Approximately 12:46 PM  I have reviewed the triage vital signs and the nursing notes.   HISTORY  Chief Complaint Urinary Frequency    HPI Jacob Vargas is a 33 y.o. male that presents emergency department for evaluation of dysuria for 4 days.  Patient was seen in this emergency department 2 days ago and treated empirically for gonorrhea and chlamydia with Rocephin and azithromycin.  Gonorrhea and Chlamydia tests are still pending.  Patient states that dysuria has not improved.  He is not having any pain when he is not urinating. He noticed a minimal amount of blood in his underwear 4 days ago. He also noticed to "lumps" to his pelvis 4 days ago that have improved over the last 2 days.  Patient states that he has masturbated this week and does not have any pain with masturbation.  Patient states that 2 weeks ago he had unprotected sexual intercourse with a male that had a dental infection and he is concerned that he may now have a staph infection.  No fever, nausea, vomiting, abdominal pain, flank pain, rash, testicular pain.  Past Medical History:  Diagnosis Date  . Patient denies medical problems     Patient Active Problem List   Diagnosis Date Noted  . Rhabdomyolysis 05/04/2019    Past Surgical History:  Procedure Laterality Date  . pt denies      Prior to Admission medications   Medication Sig Start Date End Date Taking? Authorizing Provider  cephALEXin (KEFLEX) 500 MG capsule Take 1 capsule (500 mg total) by mouth 2 (two) times daily for 10 days. 07/10/19 07/20/19  Enid Derry, PA-C  cyclobenzaprine (FLEXERIL) 5 MG tablet Take 1 tablet (5 mg total) by mouth 3 (three) times daily. 05/06/19   Altamese Dilling, MD  doxycycline (VIBRAMYCIN) 50 MG capsule Take 2 capsules (100 mg total) by mouth 2 (two) times daily for 10 days. 07/10/19  07/20/19  Enid Derry, PA-C    Allergies Patient has no known allergies.  Family History  Problem Relation Age of Onset  . Diabetes Mother     Social History Social History   Tobacco Use  . Smoking status: Never Smoker  . Smokeless tobacco: Never Used  Substance Use Topics  . Alcohol use: Yes    Comment: occasionaly  . Drug use: Yes    Types: Marijuana     Review of Systems  Constitutional: No fever/chills ENT: No upper respiratory complaints. Cardiovascular: No chest pain. Respiratory: No cough. No SOB. Gastrointestinal: No abdominal pain.  No nausea, no vomiting.  Genitourinary: Positive for dysuria.  Negative for frequency and urgency. Musculoskeletal: Negative for musculoskeletal pain. Skin: Negative for rash, abrasions, lacerations, ecchymosis. Neurological: Negative for headaches, numbness or tingling   ____________________________________________   PHYSICAL EXAM:  VITAL SIGNS: ED Triage Vitals  Enc Vitals Group     BP 07/10/19 1219 (!) 144/93     Pulse Rate 07/10/19 1219 91     Resp 07/10/19 1219 16     Temp 07/10/19 1219 98.6 F (37 C)     Temp Source 07/10/19 1219 Oral     SpO2 07/10/19 1219 99 %     Weight 07/10/19 1224 230 lb (104.3 kg)     Height 07/10/19 1224 6' (1.829 m)     Head Circumference --      Peak Flow --      Pain Score 07/10/19 1221 10  Pain Loc --      Pain Edu? --      Excl. in GC? --      Constitutional: Alert and oriented. Well appearing and in no acute distress. Eyes: Conjunctivae are normal. PERRL. EOMI. Head: Atraumatic. ENT:      Ears:      Nose: No congestion/rhinnorhea.      Mouth/Throat: Mucous membranes are moist.  Neck: No stridor.   Cardiovascular: Normal rate, regular rhythm.  Good peripheral circulation. Respiratory: Normal respiratory effort without tachypnea or retractions. Lungs CTAB. Good air entry to the bases with no decreased or absent breath sounds. Musculoskeletal: Full range of motion to  all extremities. No gross deformities appreciated. Neurologic:  Normal speech and language. No gross focal neurologic deficits are appreciated.  Skin:  Skin is warm, dry and intact. No rash noted. Psychiatric: Mood and affect are normal. Speech and behavior are normal. Patient exhibits appropriate insight and judgement.   ____________________________________________   LABS (all labs ordered are listed, but only abnormal results are displayed)  Labs Reviewed  URINALYSIS, COMPLETE (UACMP) WITH MICROSCOPIC - Abnormal; Notable for the following components:      Result Value   Color, Urine YELLOW (*)    APPearance CLEAR (*)    Ketones, ur 20 (*)    Protein, ur 30 (*)    All other components within normal limits  URINE CULTURE  HIV ANTIBODY (ROUTINE TESTING W REFLEX)  RPR   ____________________________________________  EKG   ____________________________________________  RADIOLOGY   Ct Renal Stone Study  Result Date: 07/10/2019 CLINICAL DATA:  Hematuria and dysuria. Reported soft tissue prominence in the groin regions. EXAM: CT ABDOMEN AND PELVIS WITHOUT CONTRAST TECHNIQUE: Multidetector CT imaging of the abdomen and pelvis was performed following the standard protocol without oral or IV contrast. COMPARISON:  May 04, 2019 FINDINGS: Lower chest: Lung bases are clear. Hepatobiliary: No focal liver lesions are evident on this noncontrast enhanced study. Gallbladder wall is not appreciably thickened. There is no biliary duct dilatation. Pancreas: There is no evident pancreatic mass or inflammatory focus. Spleen: No splenic lesions are evident. Adrenals/Urinary Tract: Adrenals bilaterally appear unremarkable. Kidneys bilaterally show no evident mass or hydronephrosis on either side. There is no renal or ureteral calculus on either side. Urinary bladder is midline. The thickness of urinary bladder wall is within normal limits for degree of distention. Stomach/Bowel: There is no appreciable  bowel wall or mesenteric thickening. There is no evident bowel obstruction. The terminal ileum appears unremarkable. There is no appreciable free air or portal venous air. Vascular/Lymphatic: There is no abdominal aortic aneurysm. No vascular lesions are evident on this noncontrast enhanced study. There are several subcentimeter inguinal lymph nodes, considered nonspecific. No adenopathy by size criteria is demonstrated on this study. Reproductive: Prostate and seminal vesicles appear normal in size and contour. No pelvic mass evident. Other: Appendix appears normal. No abscess or ascites evident in the abdomen or pelvis. Musculoskeletal: There is an apparent bone island in each femoral neck. No lytic or destructive bone lesions are evident. There is no intramuscular or abdominal wall lesion. IMPRESSION: 1. Multiple subcentimeter inguinal lymph nodes of uncertain etiology. Suspect reactive/inflammatory etiology as most likely. No frank adenopathy on this study by size criteria. 2. No renal or ureteral calculi. No hydronephrosis. Urinary bladder wall thickness within normal limits. 3. No bowel obstruction. No abscess in the abdomen or pelvis. Appendix appears normal. Electronically Signed   By: Bretta Bang III M.D.   On: 07/10/2019 14:20  ____________________________________________    PROCEDURES  Procedure(s) performed:    Procedures    Medications  metroNIDAZOLE (FLAGYL) tablet 2,000 mg (2,000 mg Oral Given 07/10/19 1506)  cephALEXin (KEFLEX) capsule 500 mg (500 mg Oral Given 07/10/19 1507)  doxycycline (VIBRA-TABS) tablet 100 mg (100 mg Oral Given 07/10/19 1507)     ____________________________________________   INITIAL IMPRESSION / ASSESSMENT AND PLAN / ED COURSE  Pertinent labs & imaging results that were available during my care of the patient were reviewed by me and considered in my medical decision making (see chart for details).  Review of the [place] CSRS was performed in  accordance of the [facility] prior to dispensing any controlled drugs.     Patient presented to emergency department for evaluation of dysuria for 4 days.  Vital signs and exam are reassuring.  Gonorrhea and Chlamydia test from 2 days ago are still pending.  Patient was given Rocephin and azithromycin for gonorrhea and chlamydia 2 days ago.  Patient was given today Flagyl to cover for trichomonas.  Syphilis and HIV tests were ordered.  Urinalysis today is consistent with infection.  Urine was sent for culture.  He will be given doxycycline prescription to cover for gonorrhea and chlamydia following his shot of Rocephin and azithromycin that he had received in the emergency department.  He was also given a prescription for Keflex to cover for bacterial urinary tract infection.  Patient will be discharged home with prescriptions for doxycycline and Keflex. Patient is to follow up with primary care and urology as directed.  Referral was given.  Patient is given ED precautions to return to the ED for any worsening or new symptoms.  Jacob Vargas was evaluated in Emergency Department on 07/10/2019 for the symptoms described in the history of present illness. He was evaluated in the context of the global COVID-19 pandemic, which necessitated consideration that the patient might be at risk for infection with the SARS-CoV-2 virus that causes COVID-19. Institutional protocols and algorithms that pertain to the evaluation of patients at risk for COVID-19 are in a state of rapid change based on information released by regulatory bodies including the CDC and federal and state organizations. These policies and algorithms were followed during the patient's care in the ED.   ____________________________________________  FINAL CLINICAL IMPRESSION(S) / ED DIAGNOSES  Final diagnoses:  Dysuria      NEW MEDICATIONS STARTED DURING THIS VISIT:  ED Discharge Orders         Ordered    cephALEXin (KEFLEX) 500 MG  capsule  2 times daily     07/10/19 1454    doxycycline (VIBRAMYCIN) 50 MG capsule  2 times daily     07/10/19 1454              This chart was dictated using voice recognition software/Dragon. Despite best efforts to proofread, errors can occur which can change the meaning. Any change was purely unintentional.    Enid Derry, PA-C 07/10/19 1914    Willy Eddy, MD 07/13/19 1004

## 2019-07-10 NOTE — ED Notes (Addendum)
ED PA-C Caryl Pina at bedside.

## 2019-07-10 NOTE — ED Triage Notes (Signed)
Pt states he was here 2 days ago and treated for an STD, states he is having increased burning with urination and swollen "knots" at his pelvis.

## 2019-07-10 NOTE — ED Notes (Signed)
This RN spoke with lab tech Levada Dy to add urine culture as add on.

## 2019-07-10 NOTE — Discharge Instructions (Signed)
Your gonorrhea, chlamydia, syphilis, HIV tests are still pending.  You were treated for gonorrhea and chlamydia 2 days ago.  I treated you for trichomonas today as well.  I am giving you a prescription for antibiotics to also treat a bacterial urinary tract infection and gonorrhea/chlamydia.  You can check your results on my chart over the weekend.  Please follow-up with urology next week if symptoms are not improving.

## 2019-07-11 LAB — HIV ANTIBODY (ROUTINE TESTING W REFLEX): HIV Screen 4th Generation wRfx: NONREACTIVE

## 2019-07-11 LAB — URINE CULTURE: Culture: NO GROWTH

## 2019-07-11 LAB — RPR: RPR Ser Ql: NONREACTIVE

## 2019-07-13 LAB — GC/CHLAMYDIA PROBE AMP
Chlamydia trachomatis, NAA: NEGATIVE
Neisseria Gonorrhoeae by PCR: NEGATIVE

## 2020-08-08 IMAGING — CT CT ABDOMEN AND PELVIS WITHOUT CONTRAST
2 of 4 series · 17 of 46 positions shown, 19 images · non-contrast
Comparison: None.

CLINICAL DATA: Nausea, vomiting, abdominal pain and body aches

EXAM:
CT ABDOMEN AND PELVIS WITHOUT CONTRAST
TECHNIQUE: Multidetector CT imaging of the abdomen and pelvis was performed
following the standard protocol without IV contrast.

[Series 2: axial st · axial · 0.76mm/px · z∈[-488,-68]mm · 14 of 92 slices shown, 16 images]
[im 4/92  soft-tissue]
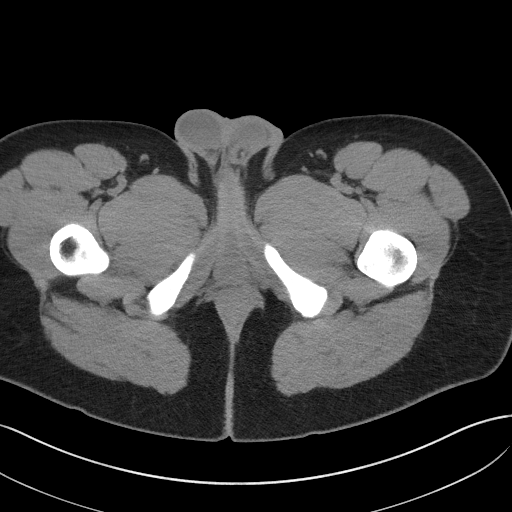
[im 4/92  bone]
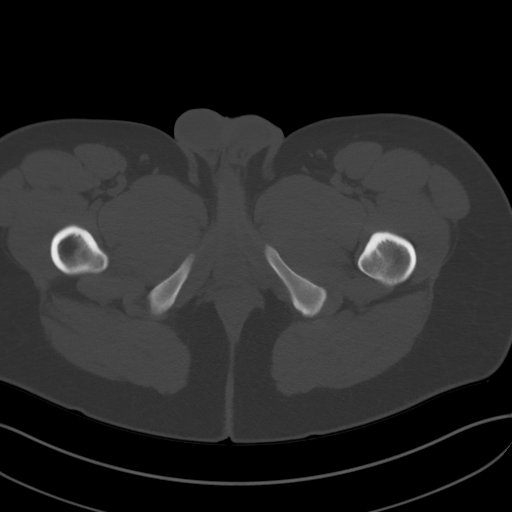
[im 11/92  soft-tissue]
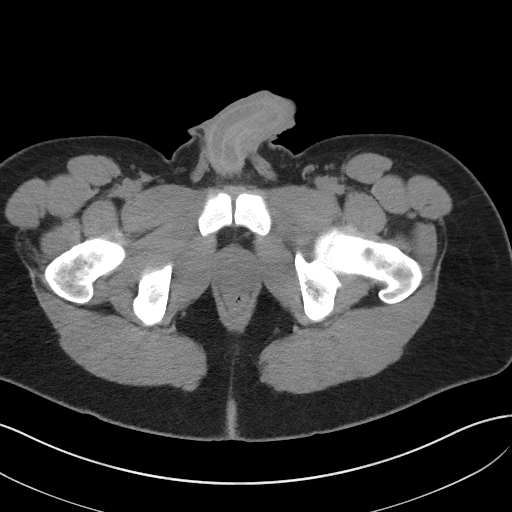
[im 19/92  soft-tissue]
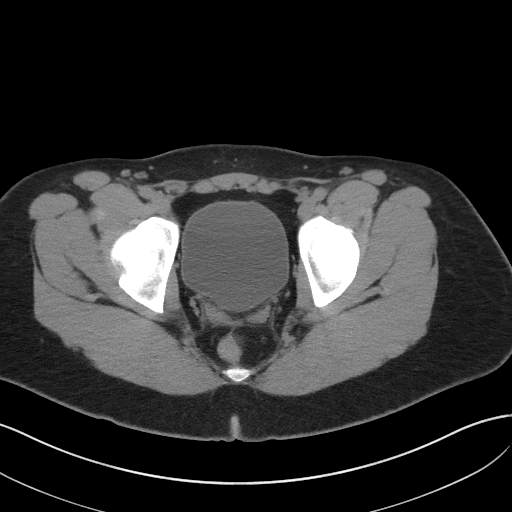
[im 26/92  soft-tissue]
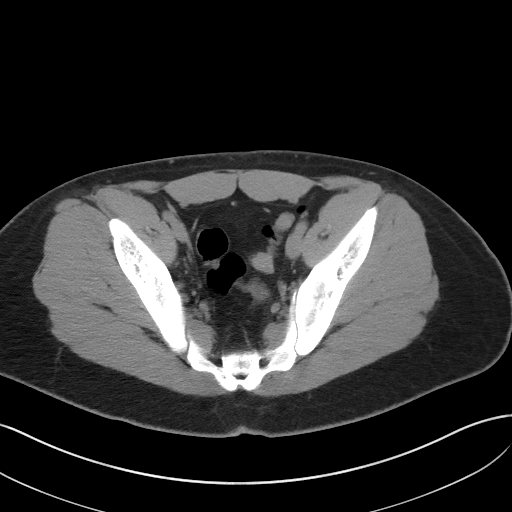
[im 30/92  soft-tissue]
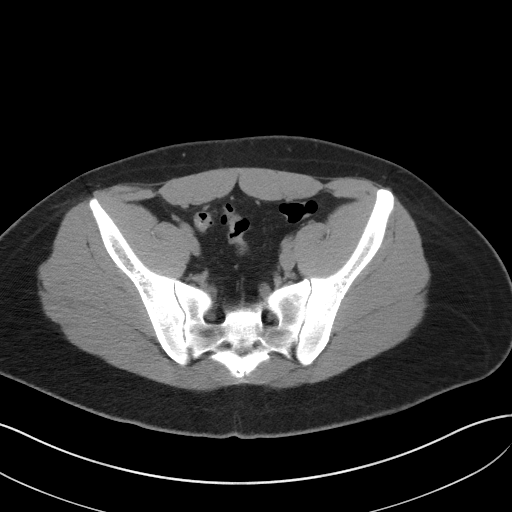
[im 37/92  soft-tissue]
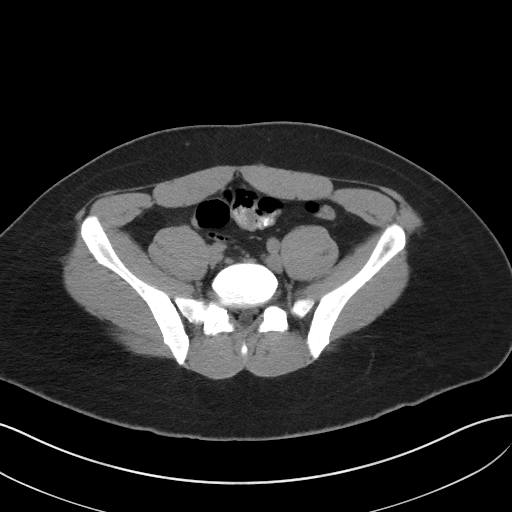
[im 44/92  soft-tissue]
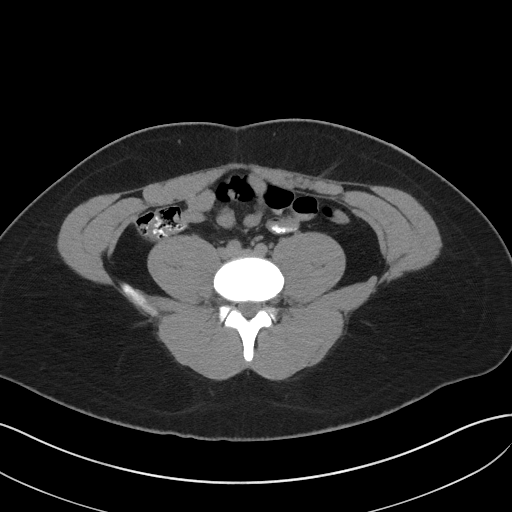
[im 48/92  soft-tissue]
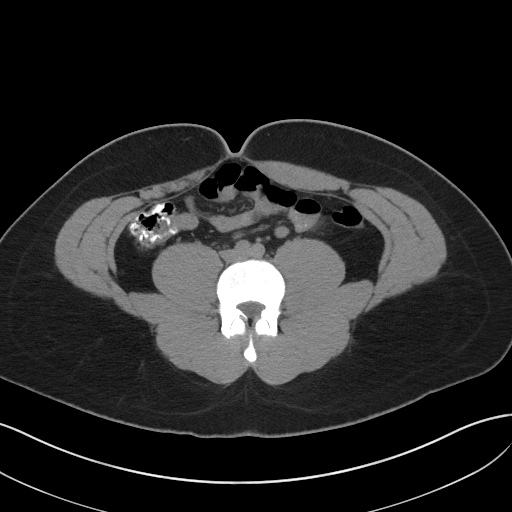
[im 55/92  soft-tissue]
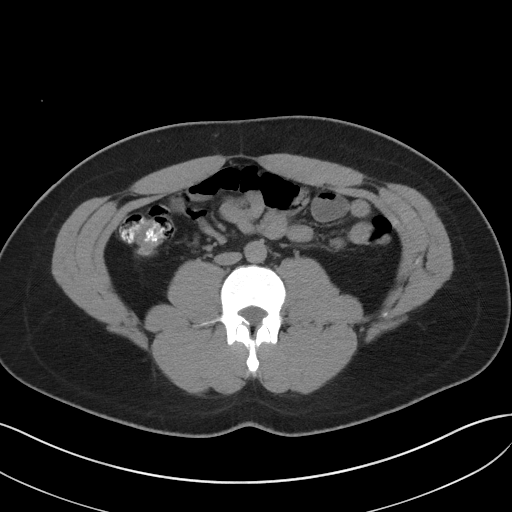
[im 55/92  bone]
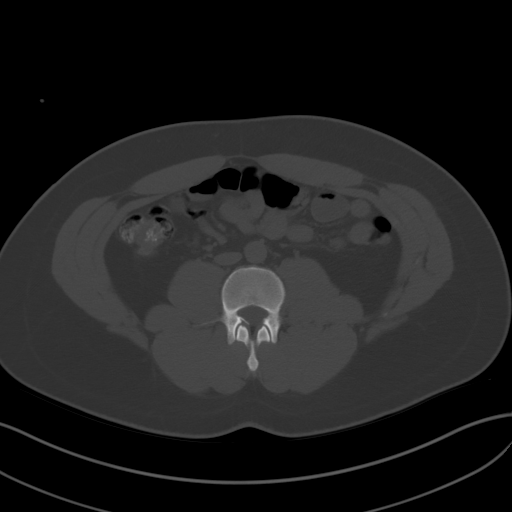
[im 62/92  soft-tissue]
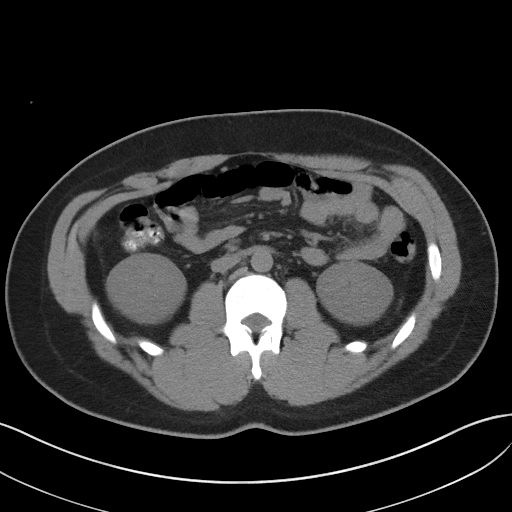
[im 70/92  soft-tissue]
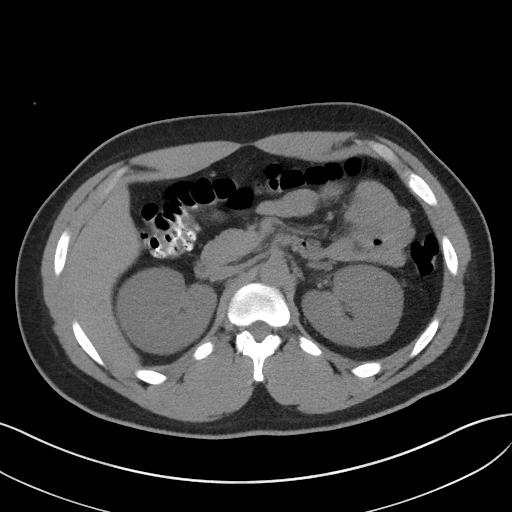
[im 73/92  soft-tissue]
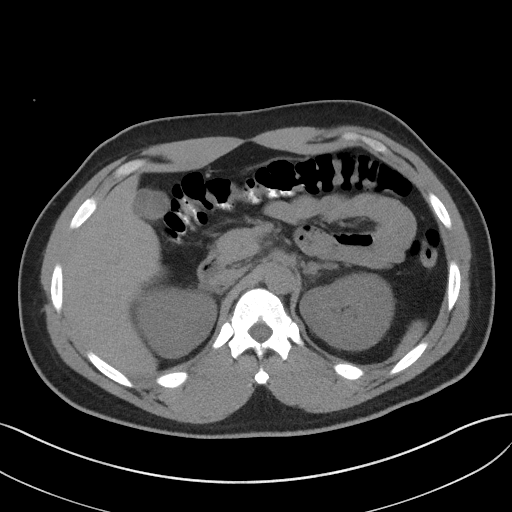
[im 81/92  soft-tissue]
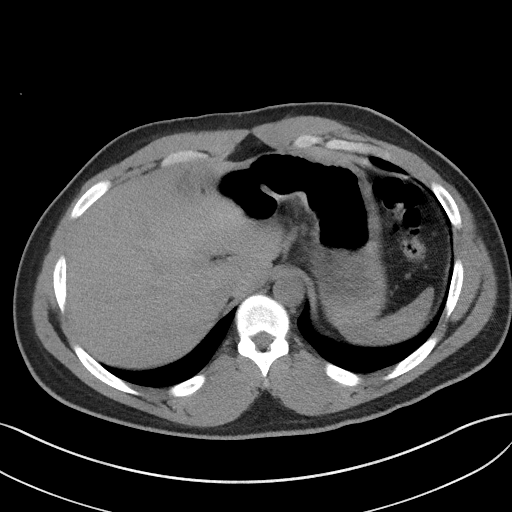
[im 88/92  soft-tissue]
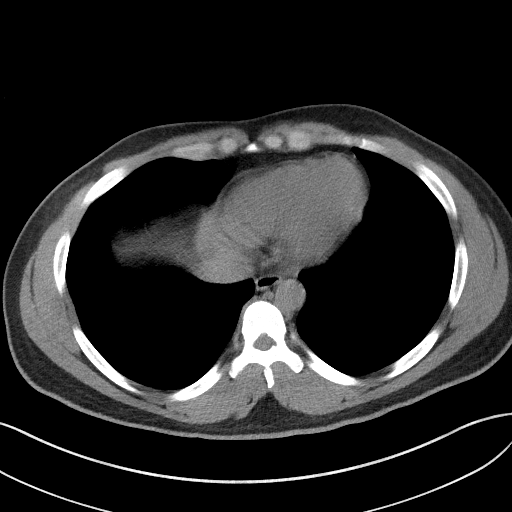

[Series 5: coronal st · coronal · 0.87mm/px · 3 of 88 slices shown]
[im 30/88  soft-tissue]
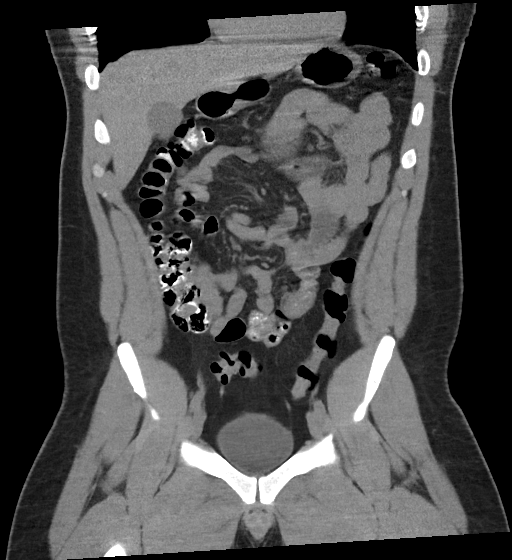
[im 39/88  soft-tissue]
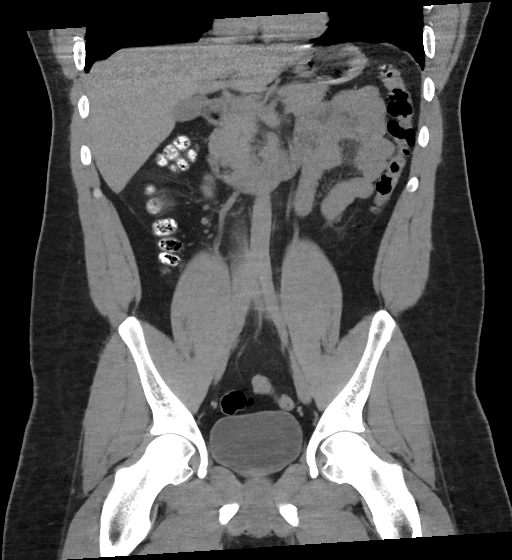
[im 49/88  soft-tissue]
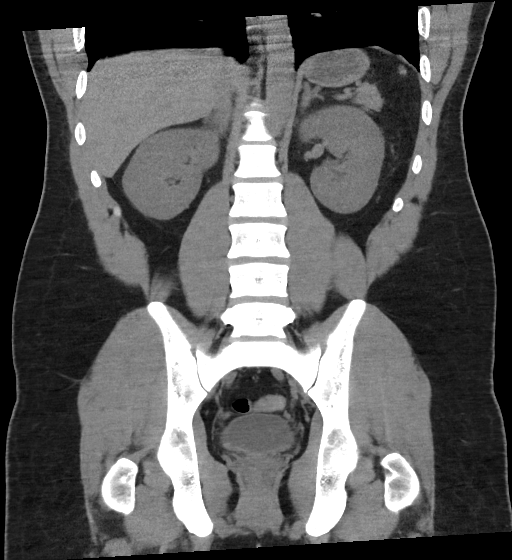

[17 of 46 positions shown; findings below may reference images not displayed]

FINDINGS: Lower chest: No acute abnormality.

Hepatobiliary: No solid liver abnormality is seen. No gallstones,
gallbladder wall thickening, or biliary dilatation.

Pancreas: Unremarkable. No pancreatic ductal dilatation or
surrounding inflammatory changes.

Spleen: Normal in size without significant abnormality.

Adrenals/Urinary Tract: Adrenal glands are unremarkable. Kidneys are
normal, without renal calculi, solid lesion, or hydronephrosis.
Bladder is unremarkable.

Stomach/Bowel: Stomach is within normal limits. Appendix appears
normal. No evidence of bowel wall thickening, distention, or
inflammatory changes.

Vascular/Lymphatic: No significant vascular findings are present. No
enlarged abdominal or pelvic lymph nodes.

Reproductive: No mass or other significant abnormality.

Other: No abdominal wall hernia or abnormality. No abdominopelvic
ascites.

Musculoskeletal: No acute or significant osseous findings.
IMPRESSION: No non-contrast CT findings of the abdomen pelvis to explain
abdominal pain, nausea, vomiting, or body aches.

## 2020-08-23 ENCOUNTER — Other Ambulatory Visit: Payer: Self-pay

## 2020-08-23 ENCOUNTER — Emergency Department
Admission: EM | Admit: 2020-08-23 | Discharge: 2020-08-23 | Disposition: A | Discharge status: Home / Self Care | Payer: Self-pay | Attending: Student in an Organized Health Care Education/Training Program | Admitting: Student in an Organized Health Care Education/Training Program

## 2020-08-23 DIAGNOSIS — H6982 Other specified disorders of Eustachian tube, left ear: Secondary | ICD-10-CM

## 2020-08-23 DIAGNOSIS — H6121 Impacted cerumen, right ear: Secondary | ICD-10-CM

## 2020-08-23 MED ORDER — PSEUDOEPHEDRINE HCL ER 120 MG PO TB12: 120.0000 mg | ORAL_TABLET | Freq: Two times a day (BID) | ORAL | 2 refills | Status: AC | PRN

## 2020-08-23 MED ORDER — CARBAMIDE PEROXIDE 6.5 % OT SOLN: 5.0000 [drp] | Freq: Two times a day (BID) | OTIC | 0 refills | Status: DC

## 2020-08-23 NOTE — ED Notes (Signed)
Patient verbalizes understanding of discharge instructions. Opportunity for questioning and answers were provided. Armband removed by staff, pt discharged from ED. Ambulated out to lobby  

## 2020-08-23 NOTE — ED Provider Notes (Signed)
[facility] Emergency Department Provider Note   ____________________________________________   First MD Initiated Contact with Patient 08/23/20 1130     (approximate)  I have reviewed the triage vital signs and the nursing notes.   HISTORY  Chief Complaint Otalgia    HPI Jacob Vargas is a 34 y.o. male patient complain of right ear pain and decreased hearing for 1 week.  No provocative incident for complaint.  Patient denies vertigo.  Patient is a 17 is 8/10.  Described pain is "achy".  No palliative measures for complaint.  Left ear not affected.         Past Medical History:  Diagnosis Date  . Patient denies medical problems     Patient Active Problem List   Diagnosis Date Noted  . Rhabdomyolysis 05/04/2019    Past Surgical History:  Procedure Laterality Date  . pt denies      Prior to Admission medications   Medication Sig Start Date End Date Taking? Authorizing Provider  carbamide peroxide (DEBROX) 6.5 % OTIC solution Place 5 drops into the right ear 2 (two) times daily. 08/23/20   Joni Reining, PA-C  cyclobenzaprine (FLEXERIL) 5 MG tablet Take 1 tablet (5 mg total) by mouth 3 (three) times daily. 05/06/19   Altamese Dilling, MD  pseudoephedrine (SUDAFED) 120 MG 12 hr tablet Take 1 tablet (120 mg total) by mouth 2 (two) times daily as needed for congestion. 08/23/20 08/23/21  Joni Reining, PA-C    Allergies Patient has no known allergies.  Family History  Problem Relation Age of Onset  . Diabetes Mother     Social History Social History   Tobacco Use  . Smoking status: Never Smoker  . Smokeless tobacco: Never Used  Substance Use Topics  . Alcohol use: Yes    Comment: occasionaly  . Drug use: Yes    Types: Marijuana    Review of Systems Constitutional: No fever/chills Eyes: No visual changes. ENT: No sore throat.  Left ear pain.  Decreased hearing. Cardiovascular: Denies chest pain. Respiratory:  Denies shortness of breath. Gastrointestinal: No abdominal pain.  No nausea, no vomiting.  No diarrhea.  No constipation. Genitourinary: Negative for dysuria. Musculoskeletal: Negative for back pain. Skin: Negative for rash. Neurological: Negative for headaches, focal weakness or numbness.   ____________________________________________   PHYSICAL EXAM:  VITAL SIGNS: ED Triage Vitals  Enc Vitals Group     BP 08/23/20 1130 133/64     Pulse Rate 08/23/20 1130 74     Resp 08/23/20 1130 16     Temp 08/23/20 1130 98.3 F (36.8 C)     Temp Source 08/23/20 1130 Oral     SpO2 08/23/20 1130 99 %     Weight 08/23/20 1130 220 lb (99.8 kg)     Height 08/23/20 1130 6' (1.829 m)     Head Circumference --      Peak Flow --      Pain Score 08/23/20 1140 8     Pain Loc --      Pain Edu? --      Excl. in GC? --    Constitutional: Alert and oriented. Well appearing and in no acute distress. EARS: Right TM not visible secondary to cerumen impaction. Neck: No stridor.  Hematological/Lymphatic/Immunilogical: No cervical lymphadenopathy. Cardiovascular: Normal rate, regular rhythm. Grossly normal heart sounds.  Good peripheral circulation. Respiratory: Normal respiratory effort.  No retractions. Lungs CTAB. Skin:  Skin is warm, dry and intact. No rash  noted. Psychiatric: Mood and affect are normal. Speech and behavior are normal.  ____________________________________________   LABS (all labs ordered are listed, but only abnormal results are displayed)  Labs Reviewed - No data to display ____________________________________________  EKG   ____________________________________________  RADIOLOGY I, Joni Reining, personally viewed and evaluated these images (plain radiographs) as part of my medical decision making, as well as reviewing the written report by the radiologist.  ED MD interpretation:    Official radiology report(s): No results  found.  ____________________________________________   PROCEDURES  Procedure(s) performed (including Critical Care):  Procedures   ____________________________________________   INITIAL IMPRESSION / ASSESSMENT AND PLAN / ED COURSE  As part of my medical decision making, I reviewed the following data within the electronic MEDICAL RECORD NUMBER         Patient presents with decreased hearing and right ear pain secondary to cerumen impaction.  Impaction removed with irrigation.  Patient given discharge care instructions and a prescription for Debrox and Sudafed.  Ad[facility].      ____________________________________________   FINAL CLINICAL IMPRESSION(S) / ED DIAGNOSES  Final diagnoses:  Impacted cerumen of right ear  Eustachian tube dysfunction, left     ED Discharge Orders         Ordered    carbamide peroxide (DEBROX) 6.5 % OTIC solution  2 times daily        08/23/20 1247    pseudoephedrine (SUDAFED) 120 MG 12 hr tablet  2 times daily PRN        08/23/20 1247          *Please note:  Jacob Vargas was evaluated in Emergency Department on 08/23/2020 for the symptoms described in the history of present illness. He was evaluated in the context of the global COVID-19 pandemic, which necessitated consideration that the patient might be at risk for infection with the SARS-CoV-2 virus that causes COVID-19. Institutional protocols and algorithms that pertain to the evaluation of patients at risk for COVID-19 are in a state of rapid change based on information released by regulatory bodies including the CDC and federal and state organizations. These policies and algorithms were followed during the patient's care in the ED.  Some ED evaluations and interventions may be delayed as a result of limited staffing during and the pandemic.*   Note:  This document was prepared using Dragon voice recognition software and may include  unintentional dictation errors.    Joni Reining, PA-C 08/23/20 1252    Willy Eddy, MD 08/23/20 [phone]

## 2020-08-23 NOTE — Discharge Instructions (Signed)
Read and follow discharge care instructions.  Use eardrops and decongestant as directed.

## 2020-08-23 NOTE — ED Triage Notes (Signed)
Pt comes via POV from home with c/o right sided ear pain for one week. Pt states decreased hearing in ear.

## 2022-10-29 ENCOUNTER — Encounter (HOSPITAL_COMMUNITY): Payer: Self-pay

## 2022-10-29 ENCOUNTER — Emergency Department (HOSPITAL_COMMUNITY)
Admission: EM | Admit: 2022-10-29 | Discharge: 2022-10-29 | Disposition: A | Discharge status: Home / Self Care | Payer: Self-pay | Attending: Emergency Medicine | Admitting: Emergency Medicine

## 2022-10-29 ENCOUNTER — Emergency Department (HOSPITAL_COMMUNITY): Payer: Self-pay

## 2022-10-29 ENCOUNTER — Other Ambulatory Visit: Payer: Self-pay

## 2022-10-29 DIAGNOSIS — R0789 Other chest pain: Secondary | ICD-10-CM | POA: Insufficient documentation

## 2022-10-29 LAB — CBC
HCT: 42.1 % (ref 39.0–52.0)
Hemoglobin: 13.6 g/dL (ref 13.0–17.0)
MCH: 30.1 pg (ref 26.0–34.0)
MCHC: 32.3 g/dL (ref 30.0–36.0)
MCV: 93.1 fL (ref 80.0–100.0)
Platelets: 285 10*3/uL (ref 150–400)
RBC: 4.52 MIL/uL (ref 4.22–5.81)
RDW: 12.6 % (ref 11.5–15.5)
WBC: 6 10*3/uL (ref 4.0–10.5)
nRBC: 0 % (ref 0.0–0.2)

## 2022-10-29 LAB — BASIC METABOLIC PANEL
Anion gap: 7 (ref 5–15)
BUN: 10 mg/dL (ref 6–20)
CO2: 27 mmol/L (ref 22–32)
Calcium: 8.7 mg/dL — ABNORMAL LOW (ref 8.9–10.3)
Chloride: 105 mmol/L (ref 98–111)
Creatinine, Ser: 0.93 mg/dL (ref 0.61–1.24)
GFR, Estimated: >60 mL/min (ref >60)
Glucose, Bld: 94 mg/dL (ref 70–99)
Potassium: 3.8 mmol/L (ref 3.5–5.1)
Sodium: 139 mmol/L (ref 135–145)

## 2022-10-29 LAB — TROPONIN I (HIGH SENSITIVITY): Troponin I (High Sensitivity): 2 ng/L (ref <18)

## 2022-10-29 MED ORDER — KETOROLAC TROMETHAMINE 15 MG/ML IJ SOLN
15.0000 mg | Freq: Once | INTRAMUSCULAR | Status: AC
  Administered 2022-10-29: 15 mg via INTRAMUSCULAR
  Filled 2022-10-29: qty 1

## 2022-10-29 MED ORDER — NAPROXEN 375 MG PO TABS: 375.0000 mg | ORAL_TABLET | Freq: Two times a day (BID) | ORAL | 0 refills | Status: AC

## 2022-10-29 NOTE — ED Notes (Signed)
Patient verbalized understanding of discharge instructions and reasons to return to the ED.  Patient had father on the phone to listen as well.

## 2022-10-29 NOTE — ED Triage Notes (Signed)
Pt c/o chest pain that started on 12/31 that started when he had vomiting episodes after drinking ETOH. Pain is described as a "gas pain" and is non-radiating. Pt reports associated cough. Denies ShOB, fever.

## 2022-10-29 NOTE — ED Provider Notes (Signed)
[facility] EMERGENCY DEPARTMENT Provider Note   CSN: [id number] Arrival date & time: 10/29/22  1150     History  Chief Complaint  Patient presents with   Chest Pain    Jacob Vargas is a 37 y.o. male.  37 year old male presents today for evaluation of left-sided chest discomfort that started a few days ago.  He states he was recently sick with URI symptoms last week.  Has not taken anything for this prior to arrival.  Denies current URI symptoms.  Without shortness of breath, wheezing.  Pain is intermittent and worse with movement or exertion.  Denies recent travel, history of DVT, PE, recent surgery.  The history is provided by the patient. No language interpreter was used.       Home Medications Prior to Admission medications   Medication Sig Start Date End Date Taking? Authorizing Provider  ibuprofen (ADVIL) 200 MG tablet Take 400 mg by mouth 2 (two) times daily as needed for fever, headache, mild pain or moderate pain.   Yes [provider]      Allergies    Patient has no known allergies.    Review of Systems   Review of Systems  Constitutional:  Negative for chills and fever.  Respiratory:  Negative for cough and shortness of breath.   Cardiovascular:  Positive for chest pain. Negative for palpitations and leg swelling.  Gastrointestinal:  Negative for abdominal pain.  Neurological:  Negative for weakness and light-headedness.  All other systems reviewed and are negative.   Physical Exam Updated Vital Signs BP (!) 146/96 (BP Location: Right Arm)   Pulse 75   Temp 98.9 F (37.2 C) (Oral)   Resp 16   Ht 5\' 11"  (1.803 m)   Wt 104.3 kg   SpO2 99%   BMI 32.08 kg/m  Physical Exam Vitals and nursing note reviewed.  Constitutional:      General: He is not in acute distress.    Appearance: Normal appearance. He is not ill-appearing.  HENT:     Head: Normocephalic and atraumatic.     Nose: Nose normal.  Eyes:      General: No scleral icterus.    Extraocular Movements: Extraocular movements intact.     Conjunctiva/sclera: Conjunctivae normal.  Cardiovascular:     Rate and Rhythm: Normal rate and regular rhythm.     Pulses: Normal pulses.  Pulmonary:     Effort: Pulmonary effort is normal. No respiratory distress.     Breath sounds: Normal breath sounds. No wheezing or rales.  Abdominal:     General: There is no distension.     Tenderness: There is no abdominal tenderness.  Musculoskeletal:        General: Normal range of motion.     Cervical back: Normal range of motion.     Right lower leg: No edema.     Left lower leg: No edema.  Skin:    General: Skin is warm and dry.  Neurological:     General: No focal deficit present.     Mental Status: He is alert. Mental status is at baseline.     ED Results / Procedures / Treatments   Labs (all labs ordered are listed, but only abnormal results are displayed) Labs Reviewed  BASIC METABOLIC PANEL - Abnormal; Notable for the following components:      Result Value   Calcium 8.7 (*)    All other components within normal limits  CBC  TROPONIN I (  HIGH SENSITIVITY)  TROPONIN I (HIGH SENSITIVITY)    EKG EKG Interpretation  Date/Time:  Monday October 29 2022 12:50:48 EST Ventricular Rate:  63 PR Interval:  132 QRS Duration: 96 QT Interval:  390 QTC Calculation: 399 R Axis:   51 Text Interpretation: Normal sinus rhythm Early repolarization Normal ECG No previous ECGs available no prior ECG for comparison. No STEMI Confirmed by Theda Belfast ([id number]) on 10/29/2022 4:12:33 PM  Radiology DG Chest 2 View  Result Date: 10/29/2022 CLINICAL DATA:  Cough, chest pain, vomiting EXAM: CHEST - 2 VIEW COMPARISON:  None Available. FINDINGS: Frontal and lateral views of the chest demonstrate an unremarkable cardiac silhouette. No airspace disease, effusion, or pneumothorax. No acute bony abnormalities. Clothing artifact overlies upper abdomen on the frontal  view. IMPRESSION: 1. No acute intrathoracic process. Electronically Signed   By: Sharlet Salina M.D.   On: 10/29/2022 13:45    Procedures Procedures    Medications Ordered in ED Medications  ketorolac (TORADOL) 15 MG/ML injection 15 mg (has no administration in time range)    ED Course/ Medical Decision Making/ A&P Clinical Course as of 10/29/22 1707  Mon Oct 29, 2022  1556 EKG 12-Lead [AA]    Clinical Course User Index [AA] Marita Kansas, PA-C                           Medical Decision Making Amount and/or Complexity of Data Reviewed Labs: ordered. Radiology: ordered. ECG/medicine tests:  Decision-making details documented in ED Course.  Risk Prescription drug management.   Medical Decision Making / ED Course   This patient presents to the ED for concern of chest pain, this involves an extensive number of treatment options, and is a complaint that carries with it a high risk of complications and morbidity.  The differential diagnosis includes ACS, PE, pneumonia, pericarditis, myocarditis, costochondritis, musculoskeletal pain, GERD  MDM: 37 year old male presents today for evaluation of left-sided chest pain.  Started a few days ago following resolution of his URI symptoms.  Low risk for PE on Wells criteria.  PERC negative.  Workup reassuring with unremarkable CBC, BMP, and initial troponin of 2.  Given duration of symptoms low suspicion for ACS.  No need to repeat troponin.  EKG showed minimal ST elevations diffusely.  EKG reviewed with attending who does not feel that EKG changes are consistent with pericarditis.  Will treat patient with naproxen for 10-day course.  Discussed follow-up with internal medicine clinic.  Return precautions discussed.  Patient voices understanding and is in agreement with plan.  Lab Tests: -I ordered, reviewed, and interpreted labs.   The pertinent results include:   Labs Reviewed  BASIC METABOLIC PANEL - Abnormal; Notable for the following  components:      Result Value   Calcium 8.7 (*)    All other components within normal limits  CBC  TROPONIN I (HIGH SENSITIVITY)  TROPONIN I (HIGH SENSITIVITY)      EKG  EKG Interpretation  Date/Time:  Monday October 29 2022 12:50:48 EST Ventricular Rate:  63 PR Interval:  132 QRS Duration: 96 QT Interval:  390 QTC Calculation: 399 R Axis:   51 Text Interpretation: Normal sinus rhythm Early repolarization Normal ECG No previous ECGs available no prior ECG for comparison. No STEMI Confirmed by Theda Belfast ([id number]) on 10/29/2022 4:12:33 PM         Imaging Studies ordered: I ordered imaging studies including CXR I independently visualized and interpreted imaging. I  agree with the radiologist interpretation   Medicines ordered and prescription drug management: Meds ordered this encounter  Medications   ketorolac (TORADOL) 15 MG/ML injection 15 mg   naproxen (NAPROSYN) 375 MG tablet    Sig: Take 1 tablet (375 mg total) by mouth 2 (two) times daily.    Dispense:  20 tablet    Refill:  0    Order Specific Question:   Supervising Provider    Answer:   MILLER, BRIAN [[id number]]    -I have reviewed the patients home medicines and have made adjustments as needed   Reevaluation: After the interventions noted above, I reevaluated the patient and found that they have :improved  Co morbidities that complicate the patient evaluation  Past Medical History:  Diagnosis Date   Patient denies medical problems    Shoulder pain       Dispostion: Patient is appropriate for discharge.  Discharged in stable condition.  Return precaution discussed.  Patient voices understanding and is in agreement with plan.  Final Clinical Impression(s) / ED Diagnoses Final diagnoses:  Chest wall pain    Rx / DC Orders ED Discharge Orders          Ordered    naproxen (NAPROSYN) 375 MG tablet  2 times daily        10/29/22 1714              Evlyn Courier, PA-C 10/29/22 1824     Tegeler, Gwenyth Allegra, MD 10/30/22 [phone]

## 2022-10-29 NOTE — ED Provider Triage Note (Signed)
Emergency Medicine Provider Triage Evaluation Note  [facility] Jacob Vargas , a 37 y.o. male  was evaluated in triage.  Pt complains of left chest pain after having a cough  Review of Systems  Positive: Fever  Negative:   Physical Exam  BP 137/89   Pulse 65   Temp 99.2 F (37.3 C) (Oral)   Resp 15   Ht 5\' 11"  (1.803 m)   Wt 104.3 kg   SpO2 98%   BMI 32.08 kg/m  Gen:   Awake, no distress   Resp:  Normal effort  MSK:   Moves extremities without difficulty  Other:    Medical Decision Making  Medically screening exam initiated at 12:51 PM.  Appropriate orders placed.  Jacob Vargas was informed that the remainder of the evaluation will be completed by another provider, this initial triage assessment does not replace that evaluation, and the importance of remaining in the ED until their evaluation is complete.     Fransico Meadow, [place] 10/29/22 1251

## 2022-10-29 NOTE — ED Notes (Signed)
Patient is resting with no s/sx of distress.

## 2022-10-29 NOTE — Discharge Instructions (Signed)
Your exam and workup today was overall reassuring.  No concerning cause of your chest pain.  We will treat you with anti-inflammatories.  For any concerning symptoms return to the emergency room otherwise have given you a referral to internal medicine center.  Please give the office a call to schedule this appointment.
